# Patient Record
Sex: Female | Born: 1975 | Race: White | Hispanic: No | Marital: Married | Smoking: Never smoker
Health system: Southern US, Community
[De-identification: ages and names within clinical notes are randomized; demographics above are authoritative.]

## PROBLEM LIST (undated history)

## (undated) ENCOUNTER — Inpatient Hospital Stay (HOSPITAL_COMMUNITY): Payer: Self-pay

## (undated) DIAGNOSIS — N83209 Unspecified ovarian cyst, unspecified side: Secondary | ICD-10-CM

## (undated) DIAGNOSIS — D509 Iron deficiency anemia, unspecified: Secondary | ICD-10-CM

## (undated) DIAGNOSIS — N390 Urinary tract infection, site not specified: Secondary | ICD-10-CM

## (undated) DIAGNOSIS — Z789 Other specified health status: Secondary | ICD-10-CM

## (undated) DIAGNOSIS — B9689 Other specified bacterial agents as the cause of diseases classified elsewhere: Secondary | ICD-10-CM

## (undated) DIAGNOSIS — O039 Complete or unspecified spontaneous abortion without complication: Secondary | ICD-10-CM

## (undated) DIAGNOSIS — N760 Acute vaginitis: Secondary | ICD-10-CM

## (undated) HISTORY — DX: Iron deficiency anemia, unspecified: D50.9

## (undated) HISTORY — DX: Unspecified ovarian cyst, unspecified side: N83.209

## (undated) HISTORY — DX: Complete or unspecified spontaneous abortion without complication: O03.9

## (undated) HISTORY — PX: DILATION AND CURETTAGE OF UTERUS: SHX78

---

## 1997-08-25 ENCOUNTER — Inpatient Hospital Stay (HOSPITAL_COMMUNITY): Admission: AD | Admit: 1997-08-25 | Discharge: 1997-08-25 | Payer: Self-pay | Admitting: Obstetrics & Gynecology

## 1997-11-28 ENCOUNTER — Inpatient Hospital Stay (HOSPITAL_COMMUNITY): Admission: AD | Admit: 1997-11-28 | Discharge: 1997-11-28 | Payer: Self-pay | Admitting: Obstetrics

## 1998-02-21 ENCOUNTER — Emergency Department (HOSPITAL_COMMUNITY): Admission: EM | Admit: 1998-02-21 | Discharge: 1998-02-21 | Payer: Self-pay | Admitting: Emergency Medicine

## 1998-06-13 ENCOUNTER — Emergency Department (HOSPITAL_COMMUNITY): Admission: EM | Admit: 1998-06-13 | Discharge: 1998-06-13 | Payer: Self-pay | Admitting: Emergency Medicine

## 1998-06-13 ENCOUNTER — Encounter: Payer: Self-pay | Admitting: Emergency Medicine

## 1999-01-14 ENCOUNTER — Other Ambulatory Visit: Admission: RE | Admit: 1999-01-14 | Discharge: 1999-01-14 | Payer: Self-pay | Admitting: Obstetrics

## 1999-04-09 ENCOUNTER — Observation Stay (HOSPITAL_COMMUNITY): Admission: AD | Admit: 1999-04-09 | Discharge: 1999-04-10 | Payer: Self-pay | Admitting: Obstetrics

## 1999-05-20 ENCOUNTER — Inpatient Hospital Stay (HOSPITAL_COMMUNITY): Admission: AD | Admit: 1999-05-20 | Discharge: 1999-05-24 | Payer: Self-pay | Admitting: Obstetrics

## 2005-04-10 ENCOUNTER — Emergency Department (HOSPITAL_COMMUNITY): Admission: EM | Admit: 2005-04-10 | Discharge: 2005-04-10 | Payer: Self-pay | Admitting: Emergency Medicine

## 2005-06-30 ENCOUNTER — Emergency Department (HOSPITAL_COMMUNITY): Admission: EM | Admit: 2005-06-30 | Discharge: 2005-06-30 | Payer: Self-pay | Admitting: Emergency Medicine

## 2006-08-02 ENCOUNTER — Emergency Department (HOSPITAL_COMMUNITY): Admission: EM | Admit: 2006-08-02 | Discharge: 2006-08-02 | Payer: Self-pay | Admitting: Emergency Medicine

## 2008-08-09 ENCOUNTER — Inpatient Hospital Stay (HOSPITAL_COMMUNITY): Admission: AD | Admit: 2008-08-09 | Discharge: 2008-08-09 | Payer: Self-pay | Admitting: Obstetrics & Gynecology

## 2008-11-02 ENCOUNTER — Emergency Department (HOSPITAL_COMMUNITY): Admission: EM | Admit: 2008-11-02 | Discharge: 2008-11-02 | Payer: Self-pay | Admitting: Emergency Medicine

## 2009-03-13 ENCOUNTER — Inpatient Hospital Stay (HOSPITAL_COMMUNITY): Admission: RE | Admit: 2009-03-13 | Discharge: 2009-03-15 | Payer: Self-pay | Admitting: Obstetrics & Gynecology

## 2010-10-03 LAB — CBC
HCT: 31.3 % — ABNORMAL LOW (ref 36.0–46.0)
HCT: 35.8 % — ABNORMAL LOW (ref 36.0–46.0)
Hemoglobin: 10.6 g/dL — ABNORMAL LOW (ref 12.0–15.0)
Hemoglobin: 11.9 g/dL — ABNORMAL LOW (ref 12.0–15.0)
MCHC: 34 g/dL (ref 30.0–36.0)
MCHC: 33.3 g/dL (ref 30.0–36.0)
MCV: 87.8 fL (ref 78.0–100.0)
MCV: 87.9 fL (ref 78.0–100.0)
Platelets: 186 10*3/uL (ref 150–400)
Platelets: 226 10*3/uL (ref 150–400)
RBC: 3.56 MIL/uL — ABNORMAL LOW (ref 3.87–5.11)
RBC: 4.07 MIL/uL (ref 3.87–5.11)
RDW: 17.1 % — ABNORMAL HIGH (ref 11.5–15.5)
RDW: 16.9 % — ABNORMAL HIGH (ref 11.5–15.5)
WBC: 9.4 10*3/uL (ref 4.0–10.5)
WBC: 9.3 10*3/uL (ref 4.0–10.5)

## 2010-10-14 LAB — POCT PREGNANCY, URINE: Preg Test, Ur: POSITIVE

## 2010-10-14 LAB — URINE CULTURE: Colony Count: >=100000

## 2010-10-14 LAB — URINALYSIS, ROUTINE W REFLEX MICROSCOPIC
Glucose, UA: NEGATIVE mg/dL
Hgb urine dipstick: NEGATIVE
Protein, ur: NEGATIVE mg/dL
Specific Gravity, Urine: 1.02 (ref 1.005–1.030)

## 2010-10-14 LAB — GC/CHLAMYDIA PROBE AMP, GENITAL
Chlamydia, DNA Probe: NEGATIVE
GC Probe Amp, Genital: NEGATIVE

## 2010-10-14 LAB — URINE MICROSCOPIC-ADD ON

## 2010-10-14 LAB — CBC
Platelets: 203 10*3/uL (ref 150–400)
WBC: 7.5 10*3/uL (ref 4.0–10.5)

## 2010-10-14 LAB — HCG, QUANTITATIVE, PREGNANCY: hCG, Beta Chain, Quant, S: 88421 m[IU]/mL — ABNORMAL HIGH (ref <5)

## 2010-11-14 NOTE — Op Note (Signed)
[facility]  Patient:    Julia Padilla                     MRN: [id number] Proc. Date: 05/21/99 Adm. Date:  [id number] Attending:  Venita Sheffield                           Operative Report  PREOPERATIVE DIAGNOSIS:       Cephalopelvic disproportion.  POSTOPERATIVE DIAGNOSIS:      Cephalopelvic disproportion.  OPERATION:  SURGEON:                      Kathreen Cosier, M.D.  ANESTHESIA:                   Epidural.  DESCRIPTION OF PROCEDURE:     Patient was placed on the operating table in supine position, abdomen prepped and draped and bladder emptied with a Foley catheter.  After she was dosed and tested, a transverse suprapubic incision was made and carried down to the rectus fascia, fascia cleaned and incised the length of the  incision, rectus muscles retracted laterally, peritoneum incised longitudinally.  A transverse incision was made in the visceroperitoneum above the bladder and bladder mobilized inferiorly.  A transverse lower uterine incision was made.  The patient was delivered from the OP position of a female, Apgar 9/9, weighing 8 pounds 14 ounces; the team was in attendance.  The placenta was anterior and removed manually.  Uterine cavity was cleaned with dry laps.  The uterine incision was closed in one layer with #1 chromic, including myometrium and endometrium. Bladder flap was reattached with 2-0 chromic.  Uterus was well-contracted.  Tubes and ovaries were normal.  Abdomen was closed in layers -- peritoneum with continuous suture of 0 chromic, fascia with continuous suture of 0 Dexon and skin closed with subcuticular sutures of 3-0 plain.  Blood loss was 500 cc.  Patient tolerated the procedure well and taken to recovery room in good condition. DD:  05/21/99 TD:  05/23/99 Job: [id number]

## 2010-11-14 NOTE — H&P (Signed)
[facility]  Patient:    Julia Padilla                     MRN: [id number] Adm. Date:  [id number] Attending:  Venita Sheffield                         History and Physical  HISTORY OF PRESENT ILLNESS:   Patient is a 35 year old gravida 1, [facility] May 23, 1999, who was admitted in labor, cervix 3 cm, 80% and vertex was -2. Amniotomy was performed at 10:40 p.m. on May 20, 1999; the fluid was clear. Her beta strep was negative.  After epidural was placed, she progressed rapidly and by 6:20 a.m., she was fully dilated and pushing.  Fetal heart went up to 180 initially for 20 minutes, then came back to normal.  She was having mild variables with her contractions.  Patient pushed for greater than an hour and a half, with the vertex molded to a 0 station and no descent of the vertex when she pushed. It was decided she would be delivered by a C-section because of CPD.  PHYSICAL EXAMINATION:  GENERAL:                      Physical exam revealed a well-developed female in  labor.  HEENT:                        Negative.  LUNGS:                        Clear.  HEART:                        Regular rhythm.  No murmurs.  No gallops.  ABDOMEN:                      Term-size uterus.  Fetal heart 140.  PELVIC:                       As described above.  EXTREMITIES:                  Negative. DD:  05/21/99 TD:  05/21/99 Job: [id number]

## 2011-05-10 ENCOUNTER — Emergency Department (HOSPITAL_COMMUNITY): Payer: Self-pay

## 2011-05-10 ENCOUNTER — Emergency Department (HOSPITAL_COMMUNITY)
Admission: EM | Admit: 2011-05-10 | Discharge: 2011-05-11 | Disposition: A | Discharge status: Home / Self Care | Payer: Self-pay | Attending: Emergency Medicine | Admitting: Emergency Medicine

## 2011-05-10 DIAGNOSIS — E876 Hypokalemia: Secondary | ICD-10-CM | POA: Insufficient documentation

## 2011-05-10 DIAGNOSIS — E860 Dehydration: Secondary | ICD-10-CM | POA: Insufficient documentation

## 2011-05-10 DIAGNOSIS — N12 Tubulo-interstitial nephritis, not specified as acute or chronic: Secondary | ICD-10-CM | POA: Insufficient documentation

## 2011-05-10 DIAGNOSIS — R000 Tachycardia, unspecified: Secondary | ICD-10-CM | POA: Insufficient documentation

## 2011-05-10 LAB — URINALYSIS, ROUTINE W REFLEX MICROSCOPIC
Protein, ur: 100 mg/dL — AB
Urobilinogen, UA: 2 mg/dL — ABNORMAL HIGH (ref 0.0–1.0)

## 2011-05-10 LAB — BASIC METABOLIC PANEL
Calcium: 9 mg/dL (ref 8.4–10.5)
GFR calc non Af Amer: 76 mL/min — ABNORMAL LOW (ref >90)
Sodium: 133 mEq/L — ABNORMAL LOW (ref 135–145)

## 2011-05-10 LAB — POCT PREGNANCY, URINE: Preg Test, Ur: NEGATIVE

## 2011-05-10 LAB — URINE MICROSCOPIC-ADD ON

## 2011-05-10 MED ORDER — PROMETHAZINE HCL 25 MG/ML IJ SOLN
12.5000 mg | Freq: Once | INTRAMUSCULAR | Status: AC
  Administered 2011-05-10: 12.5 mg via INTRAVENOUS
  Filled 2011-05-10: qty 1

## 2011-05-10 MED ORDER — SODIUM CHLORIDE 0.9 % IV BOLUS (SEPSIS): 1000.0000 mL | Freq: Once | INTRAVENOUS | Status: DC

## 2011-05-10 MED ORDER — ONDANSETRON HCL 4 MG/2ML IJ SOLN
4.0000 mg | Freq: Once | INTRAMUSCULAR | Status: AC
  Administered 2011-05-10: 4 mg via INTRAVENOUS
  Filled 2011-05-10: qty 2

## 2011-05-10 MED ORDER — SODIUM CHLORIDE 0.9 % IV BOLUS (SEPSIS)
1000.0000 mL | Freq: Once | INTRAVENOUS | Status: AC
  Administered 2011-05-10: 1000 mL via INTRAVENOUS

## 2011-05-10 MED ORDER — DEXTROSE 5 % IV SOLN
1.0000 g | Freq: Once | INTRAVENOUS | Status: AC
  Administered 2011-05-10: 1 g via INTRAVENOUS
  Filled 2011-05-10: qty 10

## 2011-05-10 MED ORDER — ACETAMINOPHEN 650 MG RE SUPP
650.0000 mg | Freq: Once | RECTAL | Status: AC
  Administered 2011-05-10: 650 mg via RECTAL
  Filled 2011-05-10: qty 1

## 2011-05-10 MED ORDER — CEPHALEXIN 500 MG PO CAPS: 500.0000 mg | ORAL_CAPSULE | Freq: Four times a day (QID) | ORAL | Status: AC

## 2011-05-10 MED ORDER — PROMETHAZINE HCL 25 MG PO TABS: 25.0000 mg | ORAL_TABLET | Freq: Four times a day (QID) | ORAL | Status: AC | PRN

## 2011-05-10 MED ORDER — POTASSIUM CHLORIDE 10 MEQ/100ML IV SOLN
10.0000 meq | Freq: Once | INTRAVENOUS | Status: AC
  Administered 2011-05-10: 10 meq via INTRAVENOUS
  Filled 2011-05-10: qty 100

## 2011-05-10 MED ORDER — PROMETHAZINE HCL 25 MG/ML IJ SOLN: 12.5000 mg | Freq: Once | INTRAMUSCULAR | Status: DC

## 2011-05-10 MED ORDER — POTASSIUM CHLORIDE CRYS ER 20 MEQ PO TBCR: 20.0000 meq | EXTENDED_RELEASE_TABLET | Freq: Every day | ORAL | Status: DC

## 2011-05-10 MED ORDER — POTASSIUM CHLORIDE CRYS ER 20 MEQ PO TBCR
40.0000 meq | EXTENDED_RELEASE_TABLET | Freq: Once | ORAL | Status: AC
  Administered 2011-05-10: 40 meq via ORAL
  Filled 2011-05-10: qty 2

## 2011-05-10 MED ORDER — SODIUM CHLORIDE 0.9 % IV SOLN
INTRAVENOUS | Status: DC
  Administered 2011-05-10: 18:00:00 via INTRAVENOUS

## 2011-05-10 NOTE — ED Notes (Signed)
MD at bedside.-H. Beverely Pace, [place]

## 2011-05-10 NOTE — ED Notes (Signed)
Patient with sudden dry heaves, HB, PA made aware

## 2011-05-10 NOTE — ED Notes (Signed)
Pt presents with cough, fever, chills, vomiting, and diarrhea since Thursday. No active vomiting at this time. Pt also states she is feeling dizzy and weak.

## 2011-05-10 NOTE — ED Provider Notes (Signed)
History     CSN: [id number] Arrival date & time: 05/10/2011  4:33 PM   First MD Initiated Contact with Patient 05/10/11 1636      Chief Complaint  Patient presents with  . Cough  . Chills  . Emesis  . Diarrhea  . Fever    (Consider location/radiation/quality/duration/timing/severity/associated sxs/prior treatment) HPI Comments: Pt presents with3 to 4 days of not feeling good. She reports cough, fever and chills. Yesterday and today she had 1-2 episodes of diarrhea. She reports multiple episodes of vomiting. No new foods or meds. She has not been out of the country or in a new environment. She has been exposed to others with "flu" like symptoms.  Patient is a 35 y.o. female presenting with cough, vomiting, diarrhea, and fever. The history is provided by the patient.  Cough This is a new problem. The current episode started more than 2 days ago. The problem occurs hourly. The problem has been gradually worsening. The cough is non-productive. The maximum temperature recorded prior to her arrival was 101 to 101.9 F. The fever has been present for 1 to 2 days. Associated symptoms include chills, sweats, headaches and myalgias. Pertinent negatives include no chest pain, no shortness of breath and no wheezing. She has tried nothing for the symptoms. The treatment provided no relief. She is not a smoker. Her past medical history is significant for bronchitis.  Emesis  Associated symptoms include chills, cough, diarrhea, a fever, headaches, myalgias and sweats. Pertinent negatives include no abdominal pain and no arthralgias.  Diarrhea The primary symptoms include fever, vomiting, diarrhea and myalgias. Primary symptoms do not include abdominal pain, dysuria or arthralgias. The illness began yesterday.  The illness is also significant for chills. The illness does not include back pain. Associated medical issues do not include gallstones, liver disease, alcohol abuse, PUD, gastric bypass, irritable  bowel syndrome or diverticulitis.  Fever Primary symptoms of the febrile illness include fever, headaches, cough, vomiting, diarrhea and myalgias. Primary symptoms do not include wheezing, shortness of breath, abdominal pain, dysuria or arthralgias.  The headache is not associated with photophobia.    History reviewed. No pertinent past medical history.  Past Surgical History  Procedure Date  . Cesarean section     No family history on file.  History  Substance Use Topics  . Smoking status: Never Smoker   . Smokeless tobacco: Not on file  . Alcohol Use: No    OB History    Grav Para Term Preterm Abortions TAB SAB Ect Mult Living                  Review of Systems  Constitutional: Positive for fever and chills. Negative for activity change.       All ROS Neg except as noted in HPI  HENT: Negative for nosebleeds and neck pain.   Eyes: Negative for photophobia and discharge.  Respiratory: Positive for cough. Negative for shortness of breath and wheezing.   Cardiovascular: Negative for chest pain and palpitations.  Gastrointestinal: Positive for vomiting and diarrhea. Negative for abdominal pain and blood in stool.  Genitourinary: Negative for dysuria, frequency and hematuria.  Musculoskeletal: Positive for myalgias. Negative for back pain and arthralgias.  Skin: Negative.   Neurological: Positive for headaches. Negative for dizziness, seizures and speech difficulty.  Psychiatric/Behavioral: Negative for hallucinations and confusion.    Allergies  Review of patient's allergies indicates no known allergies.  Home Medications  No current outpatient prescriptions on file.  BP 89/60  Pulse 145  Temp(Src) 101.2 F (38.4 C) (Oral)  Resp 26  Ht 5\' 6"  (1.676 m)  Wt 180 lb (81.647 kg)  BMI 29.05 kg/m2  SpO2 100%  LMP 05/06/2011  Physical Exam  Nursing note and vitals reviewed. Constitutional: She is oriented to person, place, and time. She appears well-developed and  well-nourished.  Non-toxic appearance.  HENT:  Head: Normocephalic.  Right Ear: Tympanic membrane and external ear normal.  Left Ear: Tympanic membrane and external ear normal.  Mouth/Throat: Uvula is midline and mucous membranes are normal.       Nasal congestion noted. Minimal increase redness of the posterior pharynx.  Eyes: EOM and lids are normal. Pupils are equal, round, and reactive to light.  Neck: Normal range of motion. Neck supple. Carotid bruit is not present.  Cardiovascular: S1 normal, S2 normal, normal heart sounds, intact distal pulses and normal pulses.  Tachycardia present.  Exam reveals no gallop.   No murmur heard. Pulmonary/Chest: No respiratory distress. She has rhonchi.  Abdominal: Soft. Bowel sounds are normal. There is no tenderness. There is no guarding.  Musculoskeletal: Normal range of motion.  Lymphadenopathy:       Head (right side): No submandibular adenopathy present.       Head (left side): No submandibular adenopathy present.    She has no cervical adenopathy.  Neurological: She is alert and oriented to person, place, and time. She has normal strength. No cranial nerve deficit or sensory deficit.  Skin: Skin is warm and dry.  Psychiatric: She has a normal mood and affect. Her speech is normal.    ED Course: 6:20 test results given to pt. Heart rate improving, but still 110. N/V improving. Plan discussed. 6:31p Pt vomiting after po potassium attempted. IV fluids and promethazine given.  Procedures (including critical care time)   Labs Reviewed  BASIC METABOLIC PANEL  URINALYSIS, ROUTINE W REFLEX MICROSCOPIC  POCT PREGNANCY, URINE   No results found.   No diagnosis found.    MDM  I have reviewed nursing notes, vital signs, and all appropriate lab and imaging results for this patient. Results for orders placed during the hospital encounter of 05/10/11  BASIC METABOLIC PANEL      Component Value Range   Sodium 133 (*) 135 - 145 (mEq/L)    Potassium 2.9 (*) 3.5 - 5.1 (mEq/L)   Chloride 101  96 - 112 (mEq/L)   CO2 24  19 - 32 (mEq/L)   Glucose, Bld 147 (*) 70 - 99 (mg/dL)   BUN 13  6 - 23 (mg/dL)   Creatinine, Ser 1.61  0.50 - 1.10 (mg/dL)   Calcium 9.0  8.4 - 09.6 (mg/dL)   GFR calc non Af Amer 76 (*) >90 (mL/min)   GFR calc Af Amer 88 (*) >90 (mL/min)  URINALYSIS, ROUTINE W REFLEX MICROSCOPIC      Component Value Range   Color, Urine YELLOW  YELLOW    Appearance CLOUDY (*) CLEAR    Specific Gravity, Urine 1.020  1.005 - 1.030    pH 6.0  5.0 - 8.0    Glucose, UA NEGATIVE  NEGATIVE (mg/dL)   Hgb urine dipstick LARGE (*) NEGATIVE    Bilirubin Urine NEGATIVE  NEGATIVE    Ketones, ur >80 (*) NEGATIVE (mg/dL)   Protein, ur 045 (*) NEGATIVE (mg/dL)   Urobilinogen, UA 2.0 (*) 0.0 - 1.0 (mg/dL)   Nitrite POSITIVE (*) NEGATIVE    Leukocytes, UA SMALL (*) NEGATIVE   POCT PREGNANCY, URINE  Component Value Range   Preg Test, Ur NEGATIVE    URINE MICROSCOPIC-ADD ON      Component Value Range   Squamous Epithelial / LPF FEW (*) RARE    WBC, UA 21-50  <3 (WBC/hpf)   RBC / HPF 21-50  <3 (RBC/hpf)   Bacteria, UA MANY (*) RARE   URINE CULTURE      Component Value Range   Specimen Description URINE, CLEAN CATCH     Special Requests NONE     Setup Time [id number]     Colony Count >=100,000 COLONIES/ML     Culture ESCHERICHIA COLI     Report Status 05/13/2011 FINAL     Organism ID, Bacteria ESCHERICHIA COLI     Dg Chest Portable 1 View  05/10/2011  *RADIOLOGY REPORT*  Clinical Data: Cough and fever  PORTABLE CHEST - 1 VIEW  Comparison: None.  Findings: Normal mediastinum and cardiac silhouette.  Normal pulmonary  vasculature.  No evidence of effusion, infiltrate, or pneumothorax.  No acute bony abnormality.  IMPRESSION: Normal chest radiograph.  Original Report Authenticated By: Genevive Bi, M.D.          Kathie Dike, [place] 05/15/11 [phone]

## 2011-05-13 LAB — URINE CULTURE
Colony Count: >=100000
Culture  Setup Time: 201211130158

## 2011-05-14 NOTE — ED Notes (Addendum)
+   urine Patient treated with Keflex-sensitive to same-chart appended per protocol MD. 

## 2011-05-15 NOTE — ED Provider Notes (Signed)
Medical screening examination/treatment/procedure(s) were performed by non-physician practitioner and as supervising physician I was immediately available for consultation/collaboration.   Joya Gaskins, MD 05/15/11 [phone]

## 2011-11-18 ENCOUNTER — Inpatient Hospital Stay (HOSPITAL_COMMUNITY)
Admission: AD | Admit: 2011-11-18 | Discharge: 2011-11-18 | Disposition: A | Discharge status: Home / Self Care | Payer: Self-pay | Source: Ambulatory Visit | Attending: Obstetrics & Gynecology | Admitting: Obstetrics & Gynecology

## 2011-11-18 ENCOUNTER — Inpatient Hospital Stay (HOSPITAL_COMMUNITY): Payer: Self-pay

## 2011-11-18 ENCOUNTER — Encounter (HOSPITAL_COMMUNITY): Payer: Self-pay | Admitting: *Deleted

## 2011-11-18 DIAGNOSIS — N912 Amenorrhea, unspecified: Secondary | ICD-10-CM | POA: Insufficient documentation

## 2011-11-18 DIAGNOSIS — Z3490 Encounter for supervision of normal pregnancy, unspecified, unspecified trimester: Secondary | ICD-10-CM

## 2011-11-18 DIAGNOSIS — Z1389 Encounter for screening for other disorder: Secondary | ICD-10-CM

## 2011-11-18 DIAGNOSIS — Z3201 Encounter for pregnancy test, result positive: Secondary | ICD-10-CM | POA: Insufficient documentation

## 2011-11-18 HISTORY — DX: Other specified health status: Z78.9

## 2011-11-18 LAB — URINALYSIS, ROUTINE W REFLEX MICROSCOPIC
Bilirubin Urine: NEGATIVE
Nitrite: NEGATIVE
Specific Gravity, Urine: 1.02 (ref 1.005–1.030)
Urobilinogen, UA: 0.2 mg/dL (ref 0.0–1.0)
pH: 7 (ref 5.0–8.0)

## 2011-11-18 LAB — HCG, QUANTITATIVE, PREGNANCY: hCG, Beta Chain, Quant, S: 39108 m[IU]/mL — ABNORMAL HIGH (ref <5)

## 2011-11-18 LAB — CBC
MCH: 26.3 pg (ref 26.0–34.0)
MCHC: 32.4 g/dL (ref 30.0–36.0)
MCV: 81.3 fL (ref 78.0–100.0)
Platelets: 199 10*3/uL (ref 150–400)
RBC: 3.84 MIL/uL — ABNORMAL LOW (ref 3.87–5.11)
RDW: 16.4 % — ABNORMAL HIGH (ref 11.5–15.5)

## 2011-11-18 LAB — URINE MICROSCOPIC-ADD ON

## 2011-11-18 LAB — WET PREP, GENITAL

## 2011-11-18 LAB — POCT PREGNANCY, URINE: Preg Test, Ur: POSITIVE — AB

## 2011-11-18 NOTE — Discharge Instructions (Signed)
No smoking, no drugs, no alcohol.  Take a prenatal vitamin one by mouth every day.  Eat small frequent snacks to avoid nausea.  Begin prenatal care as soon as possible.  Prenatal Care [facility]    [facility]  [phone]     Phone: [phone]          [facility]                      [facility]  @[place]     Phone: [phone]  Phone: [phone]         [facility]     Phone: [phone]  Phone: [phone]           [facility] [place]                hone: [phone]  Phone: [phone]         [facility] [address] Francoise Ceo      Phone: [phone]  Phone: [phone]         [facility] Dept[facility]                Phone: [phone]  [facility]   Phone:[phone]    Family [address] [place])          Phone: [phone] [facility]   Phone: [phone]      ________________________________________     To schedule your Maternity Eligibility Appointment, please call [phone].  When you arrive for your appointment you must bring the following items or information listed below.  Your appointment will be rescheduled if you do not have these items or are 15 minutes late. If currently receiving Medicaid, you MUST bring: 1. Medicaid Card 2. Social Security Card 3. Picture ID 4. Proof of Pregnancy 5. Verification of current address if the address on Medicaid card is incorrect "postmarked mail" If not receiving Medicaid, you MUST bring: 1. Social Security Card 2. Picture ID 3. Birth Certificate (if available) Passport or *Green Card 4. Proof of Pregnancy 5. Verification of current address "postmarked mail" for each income presented. 6. Verification of insurance coverage, if any 7. Check stubs from each employer for the previous month (if unable to present  check stub  for each week, we will accept check stub for the first and last week ill the same month.) If you can't locate check stubs, you must bring a letter from the employer(s) and it must have the following information on letterhead, typed, in English: o name of company o company telephone number o how long been with the company, if less than one month o how much person earns per hour o how many hours per week work o the gross pay the person earned for the previous month If you are 34 years old or less, you do not have to bring proof of income unless you work or live with the father of the baby and at that time we will need proof of income from you and/or the father of the baby. Green Card recipients are eligible for Medicaid for Pregnant Women (MPW)

## 2011-11-18 NOTE — MAU Note (Signed)
Patient states she terminated a pregnancy 3-24. Was put on BCP's and took them as ordered. Had a little brown discharge but did not have a period. Missed the second period and took a home pregnancy test that was positive. Not having any problems at this time.

## 2011-11-18 NOTE — MAU Note (Signed)
Pt had a termination middle of March.  Took birth control pills for 3 weeks.  Had some brown discharge x1 day.  Did not continue bcp.  Took home preg test last night, positive.  Denies any bleeding, cramping, or discharge.

## 2011-11-18 NOTE — MAU Provider Note (Signed)
History     CSN: [id number]  Arrival date and time: 11/18/11 1150   First Provider Initiated Contact with Patient 11/18/11 1250      Chief Complaint  Patient presents with  . Possible Pregnancy   HPI Comments: Patient is a 36 y/o G51P2012 Caucasian female present today for a missed period and possible UTI. States she had a EAB in mid-March and was given "white and brown" pills for contraception after the procedure.  Took the pills as directed but did not return to the clinic in [facility] for f/u. Home pregnancy test this AM was positive. Is currently involved in a divorce. Recently has been using condoms during intercourse with a new partner since the procedure. Unsure as to when conception might have occurred. Denies possible STI exposure, cramping or active bleeding.   OB History    Grav Para Term Preterm Abortions TAB SAB Ect Mult Living   4 2 2  0 1 1 0 0 0 2      Past Medical History  Diagnosis Date  . No pertinent past medical history     Past Surgical History  Procedure Date  . Cesarean section   . Dilation and curettage of uterus     Family History  Problem Relation Age of Onset  . Anesthesia problems Neg Hx     History  Substance Use Topics  . Smoking status: Never Smoker   . Smokeless tobacco: Not on file  . Alcohol Use: No    Allergies: No Known Allergies  No prescriptions prior to admission    Review of Systems  Constitutional: Negative for fever and chills.  Gastrointestinal: Negative.  Negative for nausea, vomiting, abdominal pain and blood in stool.  Genitourinary: Positive for urgency and frequency. Negative for dysuria, hematuria and flank pain.       No cramping, Vaginal pain or bleeding   Physical Exam   Blood pressure 121/73, pulse 16, temperature 97.9 F (36.6 C), temperature source Oral, height 5' 6.5" (1.689 m), weight 86.546 kg (190 lb 12.8 oz), SpO2 99.00%.  Physical Exam  Constitutional: She is oriented to person, place, and  time. She appears well-developed and well-nourished. No distress.  Cardiovascular: Normal rate, regular rhythm and intact distal pulses.  Exam reveals no gallop and no friction rub.   Respiratory: Effort normal and breath sounds normal. No respiratory distress. She has no wheezes. She has no rales. She exhibits no tenderness.  GI: There is no CVA tenderness.  Genitourinary: Vagina normal. Pelvic exam was performed with patient supine. No labial fusion. There is no rash, tenderness, lesion or injury on the right labia. There is no rash, tenderness, lesion or injury on the left labia. Uterus is enlarged. Cervix exhibits no discharge and no friability.       Vaginal mucosa mildly injected. Milky white discharge Cervix closed.  Some vaginal discomfort with speculum exam and bimanual. No adnexal masses appreciated Fundus palpated just below the umbilicus.   Neurological: She is alert and oriented to person, place, and time.  Skin: She is not diaphoretic.  Psychiatric: She has a normal mood and affect. Her speech is normal and behavior is normal.   Results for orders placed during the hospital encounter of 11/18/11 (from the past 24 hour(s))  URINALYSIS, ROUTINE W REFLEX MICROSCOPIC     Status: Abnormal   Collection Time   11/18/11 12:05 PM      Component Value Range   Color, Urine YELLOW  YELLOW    APPearance  CLEAR  CLEAR    Specific Gravity, Urine 1.020  1.005 - 1.030    pH 7.0  5.0 - 8.0    Glucose, UA NEGATIVE  NEGATIVE (mg/dL)   Hgb urine dipstick NEGATIVE  NEGATIVE    Bilirubin Urine NEGATIVE  NEGATIVE    Ketones, ur NEGATIVE  NEGATIVE (mg/dL)   Protein, ur NEGATIVE  NEGATIVE (mg/dL)   Urobilinogen, UA 0.2  0.0 - 1.0 (mg/dL)   Nitrite NEGATIVE  NEGATIVE    Leukocytes, UA SMALL (*) NEGATIVE   URINE MICROSCOPIC-ADD ON     Status: Abnormal   Collection Time   11/18/11 12:05 PM      Component Value Range   Squamous Epithelial / LPF FEW (*) RARE    WBC, UA TOO NUMEROUS TO COUNT  <3  (WBC/hpf)   Bacteria, UA FEW (*) RARE   POCT PREGNANCY, URINE     Status: Abnormal   Collection Time   11/18/11 12:13 PM      Component Value Range   Preg Test, Ur POSITIVE (*) NEGATIVE   CBC     Status: Abnormal   Collection Time   11/18/11  1:25 PM      Component Value Range   WBC 6.3  4.0 - 10.5 (K/uL)   RBC 3.84 (*) 3.87 - 5.11 (MIL/uL)   Hemoglobin 10.1 (*) 12.0 - 15.0 (g/dL)   HCT 62.1 (*) 30.8 - 46.0 (%)   MCV 81.3  78.0 - 100.0 (fL)   MCH 26.3  26.0 - 34.0 (pg)   MCHC 32.4  30.0 - 36.0 (g/dL)   RDW 65.7 (*) 84.6 - 15.5 (%)   Platelets 199  150 - 400 (K/uL)  HCG, QUANTITATIVE, PREGNANCY     Status: Abnormal   Collection Time   11/18/11  1:25 PM      Component Value Range   hCG, Beta Chain, Quant, S 96295 (*) <5 (mIU/mL)  WET PREP, GENITAL     Status: Abnormal   Collection Time   11/18/11  2:00 PM      Component Value Range   Yeast Wet Prep HPF POC NONE SEEN  NONE SEEN    Trich, Wet Prep NONE SEEN  NONE SEEN    Clue Cells Wet Prep HPF POC NONE SEEN  NONE SEEN    WBC, Wet Prep HPF POC FEW (*) NONE SEEN      MAU Course  Procedures UA  UA micro  Urine pregnancy Quant preg CBC Pelvic exam  Complete Abdominal US   MDM Intrauterine pregnancy- new  Failed EAB     Assessment and Plan  Awaiting [place] and lab results  Discuss prenatal care  D/C home   Assessment: Viable pregnancy at [redacted] weeks gestation  1536- serum result discussed with patient. Notified that we are awaiting [place] results. 35- [place] results discussed with patient. EDD is 05/03/12 at [redacted] weeks gestation. Zion Ta 11/18/2011, 1:29 PM

## 2011-11-18 NOTE — MAU Note (Signed)
Has a history of recurrent UTI's and has some of the symptoms now.

## 2013-03-08 ENCOUNTER — Ambulatory Visit: Payer: Self-pay | Admitting: Family Medicine

## 2014-03-20 ENCOUNTER — Emergency Department (HOSPITAL_COMMUNITY)
Admission: EM | Admit: 2014-03-20 | Discharge: 2014-03-20 | Disposition: A | Discharge status: Home / Self Care | Payer: Self-pay | Attending: Emergency Medicine | Admitting: Emergency Medicine

## 2014-03-20 ENCOUNTER — Encounter (HOSPITAL_COMMUNITY): Payer: Self-pay | Admitting: Emergency Medicine

## 2014-03-20 DIAGNOSIS — N390 Urinary tract infection, site not specified: Secondary | ICD-10-CM | POA: Insufficient documentation

## 2014-03-20 DIAGNOSIS — N898 Other specified noninflammatory disorders of vagina: Secondary | ICD-10-CM | POA: Insufficient documentation

## 2014-03-20 DIAGNOSIS — Z3202 Encounter for pregnancy test, result negative: Secondary | ICD-10-CM | POA: Insufficient documentation

## 2014-03-20 DIAGNOSIS — R300 Dysuria: Secondary | ICD-10-CM | POA: Insufficient documentation

## 2014-03-20 LAB — COMPREHENSIVE METABOLIC PANEL
ALK PHOS: 58 U/L (ref 39–117)
ALT: 10 U/L (ref 0–35)
AST: 13 U/L (ref 0–37)
Albumin: 4.1 g/dL (ref 3.5–5.2)
Anion gap: 11 (ref 5–15)
BUN: 8 mg/dL (ref 6–23)
CALCIUM: 9.2 mg/dL (ref 8.4–10.5)
CO2: 23 mEq/L (ref 19–32)
Chloride: 105 mEq/L (ref 96–112)
Creatinine, Ser: 0.74 mg/dL (ref 0.50–1.10)
GFR calc Af Amer: >90 mL/min (ref >90)
GFR calc non Af Amer: >90 mL/min (ref >90)
Glucose, Bld: 101 mg/dL — ABNORMAL HIGH (ref 70–99)
POTASSIUM: 4.4 meq/L (ref 3.7–5.3)
SODIUM: 139 meq/L (ref 137–147)
TOTAL PROTEIN: 7.7 g/dL (ref 6.0–8.3)
Total Bilirubin: 0.7 mg/dL (ref 0.3–1.2)

## 2014-03-20 LAB — HIV ANTIBODY (ROUTINE TESTING W REFLEX): HIV 1&2 Ab, 4th Generation: NONREACTIVE

## 2014-03-20 LAB — URINALYSIS, ROUTINE W REFLEX MICROSCOPIC
BILIRUBIN URINE: NEGATIVE
Glucose, UA: NEGATIVE mg/dL
HGB URINE DIPSTICK: NEGATIVE
KETONES UR: NEGATIVE mg/dL
LEUKOCYTES UA: NEGATIVE
NITRITE: NEGATIVE
Protein, ur: NEGATIVE mg/dL
SPECIFIC GRAVITY, URINE: 1.006 (ref 1.005–1.030)
Urobilinogen, UA: 0.2 mg/dL (ref 0.0–1.0)
pH: 6.5 (ref 5.0–8.0)

## 2014-03-20 LAB — CBC WITH DIFFERENTIAL/PLATELET
BASOS PCT: 0 % (ref 0–1)
Basophils Absolute: 0 10*3/uL (ref 0.0–0.1)
EOS ABS: 0 10*3/uL (ref 0.0–0.7)
EOS PCT: 1 % (ref 0–5)
HCT: 31.4 % — ABNORMAL LOW (ref 36.0–46.0)
HEMOGLOBIN: 9.2 g/dL — AB (ref 12.0–15.0)
LYMPHS PCT: 29 % (ref 12–46)
Lymphs Abs: 1.2 10*3/uL (ref 0.7–4.0)
MCH: 20 pg — AB (ref 26.0–34.0)
MCHC: 29.3 g/dL — AB (ref 30.0–36.0)
MCV: 68.1 fL — ABNORMAL LOW (ref 78.0–100.0)
MONO ABS: 0.4 10*3/uL (ref 0.1–1.0)
Monocytes Relative: 10 % (ref 3–12)
NEUTROS PCT: 60 % (ref 43–77)
Neutro Abs: 2.5 10*3/uL (ref 1.7–7.7)
PLATELETS: 273 10*3/uL (ref 150–400)
RBC: 4.61 MIL/uL (ref 3.87–5.11)
RDW: 16.7 % — ABNORMAL HIGH (ref 11.5–15.5)
WBC: 4.1 10*3/uL (ref 4.0–10.5)

## 2014-03-20 LAB — WET PREP, GENITAL
TRICH WET PREP: NONE SEEN
YEAST WET PREP: NONE SEEN

## 2014-03-20 LAB — POC URINE PREG, ED: PREG TEST UR: NEGATIVE

## 2014-03-20 LAB — CBG MONITORING, ED: Glucose-Capillary: 93 mg/dL (ref 70–99)

## 2014-03-20 LAB — RPR

## 2014-03-20 MED ORDER — METRONIDAZOLE 500 MG PO TABS: 500.0000 mg | ORAL_TABLET | Freq: Two times a day (BID) | ORAL | Status: DC

## 2014-03-20 NOTE — ED Notes (Signed)
CBG 93 

## 2014-03-20 NOTE — ED Notes (Signed)
Per pt, states urinary frequency for over a week-abdominal pain on and off

## 2014-03-20 NOTE — Discharge Instructions (Signed)

## 2014-03-20 NOTE — Progress Notes (Signed)
[facility] [employer],  Provided pt with a list of self-pay providers to help patient establish primary care. Patient stated that she was pending [employer] with job.

## 2014-03-20 NOTE — ED Provider Notes (Signed)
CSN: [id number]     Arrival date & time 03/20/14  1125 History  This chart was scribed for non-physician practitioner, Junious Silk, PA-C working with Lyanne Co, MD by Greggory Stallion, ED scribe. This patient was seen in room WTR7/WTR7 and the patient's care was started at 12:19 PM.   Chief Complaint  Patient presents with  . Urinary Tract Infection   The history is provided by the patient. No language interpreter was used.   HPI Comments: Julia Padilla is a 38 y.o. female who presents to the Emergency Department complaining of worsening urinary frequency, dysuria, urine odor and mild vaginal discharge that started about one week ago. Also reports intermittent abdominal cramping. Pt has history of bladder infections but states this doesn't feel similar. She has not taken any OTC medications yet. Denies any new sexual partners.  Past Medical History  Diagnosis Date  . No pertinent past medical history    Past Surgical History  Procedure Laterality Date  . Cesarean section    . Dilation and curettage of uterus     Family History  Problem Relation Age of Onset  . Anesthesia problems Neg Hx    History  Substance Use Topics  . Smoking status: Never Smoker   . Smokeless tobacco: Not on file  . Alcohol Use: No   OB History   Grav Para Term Preterm Abortions TAB SAB Ect Mult Living   0 1 1 0 0 0 2     Review of Systems  Gastrointestinal: Positive for abdominal pain.  Genitourinary: Positive for dysuria, frequency and vaginal discharge.  All other systems reviewed and are negative.  Allergies  Review of patient's allergies indicates no known allergies.  Home Medications   Prior to Admission medications   Not on File   BP 127/79  Pulse 91  Temp(Src) 97.8 F (36.6 C) (Oral)  Resp 16  SpO2 100%  LMP 02/23/2014  Physical Exam  Nursing note and vitals reviewed. Constitutional: She is oriented to person, place, and time. She appears well-developed and  well-nourished. No distress.  HENT:  Head: Normocephalic and atraumatic.  Right Ear: External ear normal.  Left Ear: External ear normal.  Nose: Nose normal.  Mouth/Throat: Oropharynx is clear and moist.  Eyes: Conjunctivae are normal.  Neck: Normal range of motion.  Cardiovascular: Normal rate, regular rhythm and normal heart sounds.   Pulmonary/Chest: Effort normal and breath sounds normal. No stridor. No respiratory distress. She has no wheezes. She has no rales.  Abdominal: Soft. She exhibits no distension.  Genitourinary: Vaginal discharge found.  Scant white vaginal discharge. Diffusely tender to palpation. No cervical motion tenderness. Minimal bladder prolapse.   Musculoskeletal: Normal range of motion.  Neurological: She is alert and oriented to person, place, and time. She has normal strength.  Skin: Skin is warm and dry. She is not diaphoretic. No erythema.  Psychiatric: She has a normal mood and affect. Her behavior is normal.    ED Course  Procedures (including critical care time)  DIAGNOSTIC STUDIES: Oxygen Saturation is 100% on RA, normal by my interpretation.    COORDINATION OF CARE: 12:21 PM-Advised pt of UA results. Discussed treatment plan which includes pelvic exam with pt at bedside and pt agreed to plan.   Labs Review Labs Reviewed  WET PREP, GENITAL - Abnormal; Notable for the following:    Clue Cells Wet Prep HPF POC FEW (*)    WBC, Wet Prep HPF POC FEW (*)  All other components within normal limits  CBC WITH DIFFERENTIAL - Abnormal; Notable for the following:    Hemoglobin 9.2 (*)    HCT 31.4 (*)    MCV 68.1 (*)    MCH 20.0 (*)    MCHC 29.3 (*)    RDW 16.7 (*)    All other components within normal limits  COMPREHENSIVE METABOLIC PANEL - Abnormal; Notable for the following:    Glucose, Bld 101 (*)    All other components within normal limits  GC/CHLAMYDIA PROBE AMP  URINALYSIS, ROUTINE W REFLEX MICROSCOPIC  HIV ANTIBODY (ROUTINE TESTING)  RPR   POC URINE PREG, ED  CBG MONITORING, ED    Imaging Review No results found.   EKG Interpretation None      MDM   Final diagnoses:  Dysuria    Patient presents to ED with dysuria and mild vaginal discharge. Pelvic exam shows minimal vaginal discharge, mild bladder prolapse. Wet prep shows bacterial vaginosis. Will give flagyl for this. Patient encouraged to follow up with GYN for bladder prolapse. Discussed reasons to return to ED immediately. Vital signs stable for discharge. Patient / Family / Caregiver informed of clinical course, understand medical decision-making process, and agree with plan.   I personally performed the services described in this documentation, which was scribed in my presence. The recorded information has been reviewed and is accurate.  Mora Bellman, PA-C 03/24/14 [phone]

## 2014-03-21 LAB — GC/CHLAMYDIA PROBE AMP
CT Probe RNA: NEGATIVE
GC Probe RNA: NEGATIVE

## 2014-03-24 NOTE — ED Provider Notes (Signed)
Medical screening examination/treatment/procedure(s) were performed by non-physician practitioner and as supervising physician I was immediately available for consultation/collaboration.   EKG Interpretation None        Romari Gasparro M Arine Foley, MD 03/24/14 2135 

## 2014-04-29 ENCOUNTER — Encounter (HOSPITAL_COMMUNITY): Payer: Self-pay | Admitting: Family Medicine

## 2014-04-29 ENCOUNTER — Emergency Department (HOSPITAL_COMMUNITY)
Admission: EM | Admit: 2014-04-29 | Discharge: 2014-04-29 | Disposition: A | Discharge status: Home / Self Care | Payer: Self-pay | Attending: Emergency Medicine | Admitting: Emergency Medicine

## 2014-04-29 DIAGNOSIS — R110 Nausea: Secondary | ICD-10-CM | POA: Insufficient documentation

## 2014-04-29 DIAGNOSIS — N3000 Acute cystitis without hematuria: Secondary | ICD-10-CM | POA: Insufficient documentation

## 2014-04-29 DIAGNOSIS — Z3202 Encounter for pregnancy test, result negative: Secondary | ICD-10-CM | POA: Insufficient documentation

## 2014-04-29 DIAGNOSIS — Z79899 Other long term (current) drug therapy: Secondary | ICD-10-CM | POA: Insufficient documentation

## 2014-04-29 DIAGNOSIS — R197 Diarrhea, unspecified: Secondary | ICD-10-CM | POA: Insufficient documentation

## 2014-04-29 LAB — URINE MICROSCOPIC-ADD ON

## 2014-04-29 LAB — PREGNANCY, URINE: Preg Test, Ur: NEGATIVE

## 2014-04-29 LAB — WET PREP, GENITAL
Clue Cells Wet Prep HPF POC: NONE SEEN
Trich, Wet Prep: NONE SEEN
WBC, Wet Prep HPF POC: NONE SEEN
Yeast Wet Prep HPF POC: NONE SEEN

## 2014-04-29 LAB — URINALYSIS, ROUTINE W REFLEX MICROSCOPIC
BILIRUBIN URINE: NEGATIVE
Glucose, UA: NEGATIVE mg/dL
HGB URINE DIPSTICK: NEGATIVE
Ketones, ur: NEGATIVE mg/dL
NITRITE: POSITIVE — AB
Protein, ur: NEGATIVE mg/dL
SPECIFIC GRAVITY, URINE: 1.027 (ref 1.005–1.030)
Urobilinogen, UA: 0.2 mg/dL (ref 0.0–1.0)
pH: 5.5 (ref 5.0–8.0)

## 2014-04-29 MED ORDER — CEFTRIAXONE SODIUM 1 G IJ SOLR
1.0000 g | Freq: Once | INTRAMUSCULAR | Status: AC
  Administered 2014-04-29: 1 g via INTRAMUSCULAR
  Filled 2014-04-29: qty 10

## 2014-04-29 MED ORDER — FLUCONAZOLE 150 MG PO TABS: 150.0000 mg | ORAL_TABLET | Freq: Once | ORAL | Status: DC

## 2014-04-29 MED ORDER — LIDOCAINE HCL (PF) 1 % IJ SOLN
INTRAMUSCULAR | Status: AC
  Administered 2014-04-29: 2 mL
  Filled 2014-04-29: qty 5

## 2014-04-29 MED ORDER — TRAMADOL HCL 50 MG PO TABS
50.0000 mg | ORAL_TABLET | Freq: Once | ORAL | Status: DC
  Filled 2014-04-29: qty 1

## 2014-04-29 MED ORDER — CEPHALEXIN 500 MG PO CAPS: 500.0000 mg | ORAL_CAPSULE | Freq: Two times a day (BID) | ORAL | Status: DC

## 2014-04-29 MED ORDER — LIDOCAINE HCL (PF) 1 % IJ SOLN
2.0000 mL | Freq: Once | INTRAMUSCULAR | Status: AC
  Administered 2014-04-29: 2 mL

## 2014-04-29 MED ORDER — IBUPROFEN 800 MG PO TABS: 800.0000 mg | ORAL_TABLET | Freq: Three times a day (TID) | ORAL | Status: DC | PRN

## 2014-04-29 MED ORDER — AZITHROMYCIN 250 MG PO TABS
1000.0000 mg | ORAL_TABLET | Freq: Once | ORAL | Status: AC
  Administered 2014-04-29: 1000 mg via ORAL
  Filled 2014-04-29: qty 4

## 2014-04-29 MED ORDER — IBUPROFEN 800 MG PO TABS
800.0000 mg | ORAL_TABLET | Freq: Once | ORAL | Status: AC
  Administered 2014-04-29: 800 mg via ORAL
  Filled 2014-04-29: qty 1

## 2014-04-29 NOTE — ED Notes (Signed)
Per pt recently treated for UTI. sts was better and then she got a yeast infection and treated that. No sts severe pain in vaginal area. Denies discharge.

## 2014-04-29 NOTE — Discharge Instructions (Signed)
Urinary Tract Infection Urinary tract infections (UTIs) can develop anywhere along your urinary tract. Your urinary tract is your body's drainage system for removing wastes and extra water. Your urinary tract includes two kidneys, two ureters, a bladder, and a urethra. Your kidneys are a pair of bean-shaped organs. Each kidney is about the size of your fist. They are located below your ribs, one on each side of your spine. CAUSES Infections are caused by microbes, which are microscopic organisms, including fungi, viruses, and bacteria. These organisms are so small that they can only be seen through a microscope. Bacteria are the microbes that most commonly cause UTIs. SYMPTOMS  Symptoms of UTIs may vary by age and gender of the patient and by the location of the infection. Symptoms in young women typically include a frequent and intense urge to urinate and a painful, burning feeling in the bladder or urethra during urination. Older women and men are more likely to be tired, shaky, and weak and have muscle aches and abdominal pain. A fever may mean the infection is in your kidneys. Other symptoms of a kidney infection include pain in your back or sides below the ribs, nausea, and vomiting. DIAGNOSIS To diagnose a UTI, your caregiver will ask you about your symptoms. Your caregiver also will ask to provide a urine sample. The urine sample will be tested for bacteria and white blood cells. White blood cells are made by your body to help fight infection. TREATMENT  Typically, UTIs can be treated with medication. Because most UTIs are caused by a bacterial infection, they usually can be treated with the use of antibiotics. The choice of antibiotic and length of treatment depend on your symptoms and the type of bacteria causing your infection. HOME CARE INSTRUCTIONS  If you were prescribed antibiotics, take them exactly as your caregiver instructs you. Finish the medication even if you feel better after you  have only taken some of the medication.  Drink enough water and fluids to keep your urine clear or pale yellow.  Avoid caffeine, tea, and carbonated beverages. They tend to irritate your bladder.  Empty your bladder often. Avoid holding urine for long periods of time.  Empty your bladder before and after sexual intercourse.  After a bowel movement, women should cleanse from front to back. Use each tissue only once. SEEK MEDICAL CARE IF:   You have back pain.  You develop a fever.  Your symptoms do not begin to resolve within 3 days. SEEK IMMEDIATE MEDICAL CARE IF:   You have severe back pain or lower abdominal pain.  You develop chills.  You have nausea or vomiting.  You have continued burning or discomfort with urination. MAKE SURE YOU:   Understand these instructions.  Will watch your condition.  Will get help right away if you are not doing well or get worse. Document Released: 03/25/2005 Document Revised: 12/15/2011 Document Reviewed: 07/24/2011 [facility] Patient Information 2015 [employer] [place]. This information is not intended to replace advice given to you by your health care provider. Make sure you discuss any questions you have with your health care provider.    [facility] [email or web address] [address] [place], [place] [phone]    [facility] [email or web address] [address] [place], [place] [phone]    [facility] [address] [place], [place] [phone]   [facility] [email or web address] [address] [place], [place] [phone]   [facility] [email or web address] [address] [place], [place] [phone]   [facility] [email or web address] [address] [place], [place] [phone]

## 2014-04-29 NOTE — ED Provider Notes (Signed)
TIME SEEN: 7:30 PM  CHIEF COMPLAINT: vaginal pain  HPI: Pt is a 38 year old G4 P2 A2 who just finished her menstrual period who presents emergency department with foul-smelling urine, vaginal pain for the past several days. She reports that she went to the emergency 3 weeks ago because she thought she had a UTI because she was having pain with urination. She was found to have bacterial vaginosis and started on metronidazole. She reports her symptoms do not improve so she took Monistat over-the-counter without improvement of symptoms. She states she is having severe vaginal pain. No hematuria, urinary frequency or urgency. She has had subjective fevers, nausea and diarrhea. She is also complaining of suprapubic pain. No vaginal discharge. No history of STDs. She is sexually active with one partner and they use condoms.  ROS: See HPI Constitutional: subjective fever  Eyes: no drainage  ENT: no runny nose   Cardiovascular:  no chest pain  Resp: no SOB  GI: no vomiting GU: no dysuria Integumentary: no rash  Allergy: no hives  Musculoskeletal: no leg swelling  Neurological: no slurred speech ROS otherwise negative  PAST MEDICAL HISTORY/PAST SURGICAL HISTORY:  Past Medical History  Diagnosis Date  . No pertinent past medical history     MEDICATIONS:  Prior to Admission medications   Medication Sig Start Date End Date Taking? Authorizing Provider  metroNIDAZOLE (FLAGYL) 500 MG tablet Take 1 tablet (500 mg total) by mouth 2 (two) times daily. One po bid x 7 days 03/20/14   Mora BellmanHannah S Merrell, PA-C  vitamin B-12 (CYANOCOBALAMIN) 500 MCG tablet Take 500 mcg by mouth daily.    Historical Provider, MD    ALLERGIES:  No Known Allergies  SOCIAL HISTORY:  History  Substance Use Topics  . Smoking status: Never Smoker   . Smokeless tobacco: Not on file  . Alcohol Use: No    FAMILY HISTORY: Family History  Problem Relation Age of Onset  . Anesthesia problems Neg Hx     EXAM: BP 107/65  mmHg  Pulse 85  Temp(Src) 97.7 F (36.5 C) (Oral)  Resp 10  SpO2 100%  LMP 04/29/2014 CONSTITUTIONAL: Alert and oriented and responds appropriately to questions. Well-appearing; well-nourished, in no distress, smiling, laughing HEAD: Normocephalic EYES: Conjunctivae clear, PERRL ENT: normal nose; no rhinorrhea; moist mucous membranes; pharynx without lesions noted NECK: Supple, no meningismus, no LAD  CARD: RRR; S1 and S2 appreciated; no murmurs, no clicks, no rubs, no gallops RESP: Normal chest excursion without splinting or tachypnea; breath sounds clear and equal bilaterally; no wheezes, no rhonchi, no rales,  ABD/GI: Normal bowel sounds; non-distended; soft, mild suprapubic tenderness, no tenderness at McBurney's point, negative Murphy sign, no guarding or rebound GU:  Normal external genitalia without lesions or irritation, patient has large amount of white was slightly yellow tinge without foul odor vaginal discharge, there is no cervical motion tenderness or adnexal tenderness or fullness, cervix is not friable or erythematous, no vaginal bleeding BACK:  The back appears normal and is non-tender to palpation, there is no CVA tenderness EXT: Normal ROM in all joints; non-tender to palpation; no edema; normal capillary refill; no cyanosis    SKIN: Normal color for age and race; warm NEURO: Moves all extremities equally PSYCH: The patient's mood and manner are appropriate. Grooming and personal hygiene are appropriate.  MEDICAL DECISION MAKING: agent does have a urinary tract infection. We'll send culture. She would like a dose of IM ceftriaxone. We'll perform pelvic exam with cultures. Her abdominal exam is  relatively benign. She is well-appearing with no flank pain. Doubt pyelonephritis, urosepsis. Doubt renal stone.  ED PROGRESS: Pelvic exam is relatively unremarkable. Pelvic cultures pending. She states she was recently tested for gonorrhea and chlamydia and this was negative. Wet  prep pending.   9:36 PM  Pt's wet prep is negative.  Given large amount of discharge, will empirically treat for gonorrhea and chlamydia. She is ready received IM ceftriaxone. We'll give azithromycin. Cultures pending. We'll discharge with prescription for Keflex for UTI. Culture is pending. We'll discharge with Diflucan in case she develops a yeast infection after being on Keflex. I feel she is safe to be discharged home. We'll discharge with prescription for ibuprofen for pain as well. Discussed return precautions and supportive care instructions. She verbalized understanding and is comfortable with plan.     Layla MawKristen N Augustin Bun, DO 04/29/14 2137

## 2014-04-30 ENCOUNTER — Encounter (HOSPITAL_COMMUNITY): Payer: Self-pay | Admitting: Family Medicine

## 2014-05-01 LAB — GC/CHLAMYDIA PROBE AMP
CT Probe RNA: NEGATIVE
GC PROBE AMP APTIMA: NEGATIVE

## 2014-05-02 LAB — URINE CULTURE: Colony Count: >=100000

## 2014-05-03 ENCOUNTER — Telehealth (HOSPITAL_COMMUNITY): Payer: Self-pay

## 2014-05-03 NOTE — ED Notes (Signed)
Post ED Visit - Positive Culture Follow-up  Culture report reviewed by antimicrobial stewardship pharmacist: []  Wes Dulaney, Pharm.D., BCPS []  Celedonio MiyamotoJeremy Frens, Pharm.D., BCPS []  Georgina PillionElizabeth Martin, Pharm.D., BCPS []  [place] SpringsMinh Pham, [address]Ph[address]D., BCPS, AAHIVP []  Estella HuskMichelle Turner, Pharm.D., BCPS, AAHIVP [x]  Babs BertinHaley Baird, [address] BoulevardPharm.D.   Positive urine culture Treated with cephalexin , organism sensitive to the same and no further patient follow-up is required at this time.  Ashley JacobsFesterman, Pola Furno C 05/03/2014, 12:12 PM

## 2014-05-16 ENCOUNTER — Inpatient Hospital Stay (HOSPITAL_COMMUNITY): Payer: Self-pay

## 2014-05-16 ENCOUNTER — Inpatient Hospital Stay (HOSPITAL_COMMUNITY)
Admission: AD | Admit: 2014-05-16 | Discharge: 2014-05-16 | Disposition: A | Discharge status: Home / Self Care | Payer: Self-pay | Source: Ambulatory Visit | Attending: Family Medicine | Admitting: Family Medicine

## 2014-05-16 ENCOUNTER — Encounter (HOSPITAL_COMMUNITY): Payer: Self-pay | Admitting: *Deleted

## 2014-05-16 DIAGNOSIS — K5900 Constipation, unspecified: Secondary | ICD-10-CM | POA: Insufficient documentation

## 2014-05-16 DIAGNOSIS — N898 Other specified noninflammatory disorders of vagina: Secondary | ICD-10-CM

## 2014-05-16 DIAGNOSIS — N949 Unspecified condition associated with female genital organs and menstrual cycle: Secondary | ICD-10-CM | POA: Insufficient documentation

## 2014-05-16 HISTORY — DX: Urinary tract infection, site not specified: N39.0

## 2014-05-16 HISTORY — DX: Other specified bacterial agents as the cause of diseases classified elsewhere: N76.0

## 2014-05-16 HISTORY — DX: Other specified bacterial agents as the cause of diseases classified elsewhere: B96.89

## 2014-05-16 LAB — URINALYSIS, ROUTINE W REFLEX MICROSCOPIC
Bilirubin Urine: NEGATIVE
Glucose, UA: NEGATIVE mg/dL
Ketones, ur: NEGATIVE mg/dL
Nitrite: NEGATIVE
PH: 6 (ref 5.0–8.0)
Protein, ur: NEGATIVE mg/dL
Urobilinogen, UA: 0.2 mg/dL (ref 0.0–1.0)

## 2014-05-16 LAB — URINE MICROSCOPIC-ADD ON

## 2014-05-16 LAB — CBC
HEMATOCRIT: 28.8 % — AB (ref 36.0–46.0)
HEMOGLOBIN: 8.4 g/dL — AB (ref 12.0–15.0)
MCH: 20.1 pg — AB (ref 26.0–34.0)
MCHC: 29.2 g/dL — ABNORMAL LOW (ref 30.0–36.0)
MCV: 68.9 fL — ABNORMAL LOW (ref 78.0–100.0)
Platelets: 227 10*3/uL (ref 150–400)
RBC: 4.18 MIL/uL (ref 3.87–5.11)
RDW: 17.8 % — ABNORMAL HIGH (ref 11.5–15.5)
WBC: 5.4 10*3/uL (ref 4.0–10.5)

## 2014-05-16 LAB — POCT PREGNANCY, URINE: Preg Test, Ur: NEGATIVE

## 2014-05-16 LAB — WET PREP, GENITAL
CLUE CELLS WET PREP: NONE SEEN
Trich, Wet Prep: NONE SEEN

## 2014-05-16 LAB — HIV ANTIBODY (ROUTINE TESTING W REFLEX): HIV: NONREACTIVE

## 2014-05-16 MED ORDER — FLUCONAZOLE 150 MG PO TABS: 150.0000 mg | ORAL_TABLET | Freq: Every day | ORAL | Status: DC

## 2014-05-16 MED ORDER — TRAMADOL HCL 50 MG PO TABS: 50.0000 mg | ORAL_TABLET | Freq: Four times a day (QID) | ORAL | Status: DC | PRN

## 2014-05-16 MED ORDER — KETOROLAC TROMETHAMINE 60 MG/2ML IM SOLN
60.0000 mg | Freq: Once | INTRAMUSCULAR | Status: AC
  Administered 2014-05-16: 60 mg via INTRAMUSCULAR
  Filled 2014-05-16: qty 2

## 2014-05-16 MED ORDER — IBUPROFEN 600 MG PO TABS: 600.0000 mg | ORAL_TABLET | Freq: Four times a day (QID) | ORAL | Status: DC | PRN

## 2014-05-16 MED ORDER — IOHEXOL 300 MG/ML  SOLN
50.0000 mL | INTRAMUSCULAR | Status: AC
  Administered 2014-05-16: 50 mL via ORAL

## 2014-05-16 MED ORDER — IOHEXOL 300 MG/ML  SOLN
100.0000 mL | Freq: Once | INTRAMUSCULAR | Status: AC | PRN
  Administered 2014-05-16: 100 mL via INTRAVENOUS

## 2014-05-16 NOTE — MAU Note (Addendum)
States was treated about a month ago for a bacterial infection, then yeast. [facility] couple of [facility] and [facility] for [facility]. States she has had vaginal pain and has continued throughout these episodes. Pain when trying to insert tampon and pain with intercourse. Notes some blood on tissue after wiping. Has tested negative for STI's. Has a lot of discharge, clear, no odor.

## 2014-05-16 NOTE — MAU Provider Note (Signed)
History     CSN: [id number]  Arrival date and time: 05/16/14 [id number]   First Provider Initiated Contact with Patient 05/16/14 [phone]      Chief Complaint  Patient presents with  . Vaginal Pain   HPI   Ms. Julia Padilla is a 38 y.o. female who presents with vaginal pain. The pain is everywhere though out her vagina and worsens with urination and intercourse. The patient had intercourse this morning and it was extremely painful. The pain was during intercourse and after intercourse.  No new sexual partners; has had the same partner for 4 years.  Patient has been treated for several presumed vaginal infections over the last few weeks due to this vaginal pain.     OB History    Gravida Para Term Preterm AB TAB SAB Ectopic Multiple Living   4 2 2  0 1 1 0 0 0 2      Past Medical History  Diagnosis Date  . No pertinent past medical history   . UTI (lower urinary tract infection)   . Bacterial vaginosis     Past Surgical History  Procedure Laterality Date  . Cesarean section    . Dilation and curettage of uterus      Family History  Problem Relation Age of Onset  . Anesthesia problems Neg Hx     History  Substance Use Topics  . Smoking status: Never Smoker   . Smokeless tobacco: Not on file  . Alcohol Use: No    Allergies: No Known Allergies  Prescriptions prior to admission  Medication Sig Dispense Refill Last Dose  . cephALEXin (KEFLEX) 500 MG capsule Take 1 capsule (500 mg total) by mouth 2 (two) times daily. 14 capsule 0   . fluconazole (DIFLUCAN) 150 MG tablet Take 1 tablet (150 mg total) by mouth once. 1 tablet 0   . ibuprofen (ADVIL,MOTRIN) 800 MG tablet Take 1 tablet (800 mg total) by mouth every 8 (eight) hours as needed for mild pain. 30 tablet 0   . metroNIDAZOLE (FLAGYL) 500 MG tablet Take 1 tablet (500 mg total) by mouth 2 (two) times daily. One po bid x 7 days 14 tablet 0   . vitamin B-12 (CYANOCOBALAMIN) 500 MCG tablet Take 500 mcg by mouth daily.    Past Week at Unknown time    Results for orders placed or performed during the hospital encounter of 05/16/14 (from the past 48 hour(s))  Urinalysis, Routine w reflex microscopic     Status: Abnormal   Collection Time: 05/16/14  8:42 AM  Result Value Ref Range   Color, Urine YELLOW YELLOW   APPearance CLOUDY (A) CLEAR   Specific Gravity, Urine >1.030 (H) 1.005 - 1.030   pH 6.0 5.0 - 8.0   Glucose, UA NEGATIVE NEGATIVE mg/dL   Hgb urine dipstick TRACE (A) NEGATIVE   Bilirubin Urine NEGATIVE NEGATIVE   Ketones, ur NEGATIVE NEGATIVE mg/dL   Protein, ur NEGATIVE NEGATIVE mg/dL   Urobilinogen, UA 0.2 0.0 - 1.0 mg/dL   Nitrite NEGATIVE NEGATIVE   Leukocytes, UA LARGE (A) NEGATIVE  Urine microscopic-add on     Status: Abnormal   Collection Time: 05/16/14  8:42 AM  Result Value Ref Range   Squamous Epithelial / LPF FEW (A) RARE   WBC, UA 21-50 <3 WBC/hpf   RBC / HPF 0-2 <3 RBC/hpf   Bacteria, UA FEW (A) RARE   Urine-Other MUCOUS PRESENT   Pregnancy, urine POC     Status: None  Collection Time: 05/16/14  9:07 AM  Result Value Ref Range   Preg Test, Ur NEGATIVE NEGATIVE    Comment:        THE SENSITIVITY OF THIS METHODOLOGY IS >24 mIU/mL   Wet prep, genital     Status: Abnormal   Collection Time: 05/16/14  9:15 AM  Result Value Ref Range   Yeast Wet Prep HPF POC RARE (A) NONE SEEN   Trich, Wet Prep NONE SEEN NONE SEEN   Clue Cells Wet Prep HPF POC NONE SEEN NONE SEEN   WBC, Wet Prep HPF POC FEW (A) NONE SEEN    Comment: FEW BACTERIA SEEN  CBC     Status: Abnormal   Collection Time: 05/16/14  9:25 AM  Result Value Ref Range   WBC 5.4 4.0 - 10.5 K/uL   RBC 4.18 3.87 - 5.11 MIL/uL   Hemoglobin 8.4 (L) 12.0 - 15.0 g/dL   HCT 56.228.8 (L) 13.036.0 - 86.546.0 %   MCV 68.9 (L) 78.0 - 100.0 fL   MCH 20.1 (L) 26.0 - 34.0 pg   MCHC 29.2 (L) 30.0 - 36.0 g/dL   RDW 78.417.8 (H) 69.611.5 - 29.515.5 %   Platelets 227 150 - 400 K/uL   Ct Pelvis W Contrast  05/16/2014   CLINICAL DATA:  Dyspareunia. Pelvic  pain. Recent urinary tract infection and treatment for bacterial vaginosis.  EXAM: CT PELVIS WITH CONTRAST  TECHNIQUE: Multidetector CT imaging of the pelvis was performed using the standard protocol following the bolus administration of intravenous contrast.  CONTRAST:  100mL OMNIPAQUE IOHEXOL 300 MG/ML  SOLN  COMPARISON:  None.  FINDINGS: Appendix normal. Uterus 10.5 cm in length as measured along the endometrium, 7.8 cm transverse and 5.8 cm anterior-posterior. Small nabothian cyst, image 72 series 603.  2.6 by 2.2 cm complex cystic left ovarian lesion with enhancing margins, possibly a corpus luteum or collapsed complex cyst. Ovaries otherwise unremarkable. No significant free pelvic fluid.  Within the vagina or along the vaginal wall, there is a 2.9 by 2.8 by 1.5 cm hypodense lesion just to the right of midline, internal density approximately 22 Hounsfield units.  Endometrium difficult to accurately measure but probably about 1.3 cm.  Review of the scout image demonstrates prominence of stool throughout the colon.  IMPRESSION: 1. The dominant finding is a complex hypodense structure slightly eccentric to the right in the vagina. 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 If further workup confirms a Gartner duct cyst, it might be prudent to obtain a renal ultrasound to rule out associated abnormality of the metanephric urinary system (renal dysplasia, renal agenesis, or cross fused ectopia). 2.  Prominent stool throughout the colon favors constipation.   Electronically Signed   By: Herbie BaltimoreWalt  Liebkemann M.D.   On: 05/16/2014 11:27    Review of Systems  Constitutional: Negative for fever and chills.  Gastrointestinal: Negative for abdominal pain.  Genitourinary: Positive for dysuria. Negative for hematuria.       +vaginal pain.  + vaginal discharge    Physical Exam   Blood pressure 117/69,  pulse 102, temperature 98.2 F (36.8 C), temperature source Oral, resp. rate 18, height 5\' 6"  (1.676 m), weight 77.111 kg (170 lb), last menstrual period 04/29/2014.  Physical Exam  Constitutional: She is oriented to person, place, and time. She appears well-developed and well-nourished. No distress.  HENT:  Head: Normocephalic.  Eyes: Pupils are equal,  round, and reactive to light.  Neck: Neck supple.  GI: Soft. She exhibits no distension. There is no tenderness.  Genitourinary:  Speculum exam: Vagina - Small amount of creamy discharge, no odor Cervix - No contact bleeding Bimanual exam: Cervix closed, no CMT  3 cm, fluctuant cyst noted on the right side of vaginal wall. Tender to touch.  Large amount of stool palpated  Uterus non tender, normal size GC/Chlam, wet prep done Chaperone present for exam.   Musculoskeletal: Normal range of motion.  Neurological: She is alert and oriented to person, place, and time.  Skin: Skin is warm. She is not diaphoretic.  Psychiatric: Her behavior is normal.    MAU Course  Procedures  None  MDM Consulted with Dr. Adrian Blackwater regarding large right vaginal wall cyst Toradol 60 mg IM CBC Pelvic CT  Discussed CT scan results with Dr. 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 Follow up in the clinic; patient may need marsupialization Return to MAU if symptoms worsen    Iona Hansen Tykeisha Peer, NP 05/16/2014 7:42 PM

## 2014-05-16 NOTE — Discharge Instructions (Signed)

## 2014-05-17 LAB — GC/CHLAMYDIA PROBE AMP
CT PROBE, AMP APTIMA: NEGATIVE
GC PROBE AMP APTIMA: NEGATIVE

## 2014-05-28 ENCOUNTER — Encounter: Payer: Self-pay | Admitting: Obstetrics & Gynecology

## 2014-05-28 ENCOUNTER — Ambulatory Visit (INDEPENDENT_AMBULATORY_CARE_PROVIDER_SITE_OTHER): Payer: Self-pay | Admitting: Obstetrics & Gynecology

## 2014-05-28 VITALS — BP 110/72 | HR 90 | Temp 98.0°F | Ht 66.0 in | Wt 171.6 lb

## 2014-05-28 DIAGNOSIS — Q505 Embryonic cyst of broad ligament: Secondary | ICD-10-CM

## 2014-05-28 DIAGNOSIS — Q524 Other congenital malformations of vagina: Secondary | ICD-10-CM

## 2014-05-28 MED ORDER — IBUPROFEN 800 MG PO TABS: 800.0000 mg | ORAL_TABLET | Freq: Three times a day (TID) | ORAL | Status: DC | PRN

## 2014-05-28 MED ORDER — OXYCODONE-ACETAMINOPHEN 5-325 MG PO TABS: 1.0000 | ORAL_TABLET | ORAL | Status: DC | PRN

## 2014-05-28 NOTE — Progress Notes (Signed)
   Subjective:    Patient ID: Julia Padilla, female    DOB: [birth year], 38 y.o.   MRN: [id number]  HPI  Ms Duffy RhodyStanley is here because of a month of severe vaginal pain along with discharge. 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 She has been unable to have sex due to the pain.  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 Soledad GerlachSam Lentz who has seen these in the past and he is willing to see her.

## 2014-11-29 ENCOUNTER — Emergency Department (HOSPITAL_COMMUNITY)
Admission: EM | Admit: 2014-11-29 | Discharge: 2014-11-29 | Disposition: A | Discharge status: Home / Self Care | Payer: 59 | Attending: Emergency Medicine | Admitting: Emergency Medicine

## 2014-11-29 ENCOUNTER — Encounter (HOSPITAL_COMMUNITY): Payer: Self-pay

## 2014-11-29 DIAGNOSIS — H66002 Acute suppurative otitis media without spontaneous rupture of ear drum, left ear: Secondary | ICD-10-CM | POA: Insufficient documentation

## 2014-11-29 DIAGNOSIS — Z87440 Personal history of urinary (tract) infections: Secondary | ICD-10-CM | POA: Insufficient documentation

## 2014-11-29 DIAGNOSIS — H9202 Otalgia, left ear: Secondary | ICD-10-CM | POA: Diagnosis present

## 2014-11-29 DIAGNOSIS — Z8742 Personal history of other diseases of the female genital tract: Secondary | ICD-10-CM | POA: Diagnosis not present

## 2014-11-29 DIAGNOSIS — J029 Acute pharyngitis, unspecified: Secondary | ICD-10-CM | POA: Insufficient documentation

## 2014-11-29 MED ORDER — AMOXICILLIN 250 MG PO CAPS
500.0000 mg | ORAL_CAPSULE | Freq: Once | ORAL | Status: AC
  Administered 2014-11-29: 500 mg via ORAL
  Filled 2014-11-29: qty 2

## 2014-11-29 MED ORDER — IBUPROFEN 600 MG PO TABS: 600.0000 mg | ORAL_TABLET | Freq: Four times a day (QID) | ORAL | Status: DC | PRN

## 2014-11-29 MED ORDER — AMOXICILLIN 500 MG PO CAPS: 500.0000 mg | ORAL_CAPSULE | Freq: Three times a day (TID) | ORAL | Status: AC

## 2014-11-29 MED ORDER — IBUPROFEN 800 MG PO TABS
800.0000 mg | ORAL_TABLET | Freq: Once | ORAL | Status: AC
  Administered 2014-11-29: 800 mg via ORAL
  Filled 2014-11-29: qty 2

## 2014-11-29 NOTE — ED Notes (Signed)
Pt c/o left earache x 2 hours.

## 2014-11-29 NOTE — ED Provider Notes (Signed)
CSN: [id number]     Arrival date & time 11/29/14  1537 History   First MD Initiated Contact with Patient 11/29/14 1612     Chief Complaint  Patient presents with  . Otalgia     (Consider location/radiation/quality/duration/timing/severity/associated sxs/prior Treatment) Patient is a 39 y.o. female presenting with ear pain. The history is provided by the patient.  Otalgia Location:  Left Behind ear:  No abnormality Quality:  Aching and sharp Severity:  Moderate Onset quality:  Sudden Duration:  2 hours Timing:  Constant Progression:  Worsening Chronicity:  New Context: not foreign body in ear and no water in ear   Relieved by:  Nothing Worsened by:  Nothing tried Ineffective treatments:  OTC medications Associated symptoms: congestion, hearing loss, rhinorrhea and sore throat   Associated symptoms: no abdominal pain, no cough, no ear discharge, no fever, no headaches, no neck pain, no rash and no tinnitus     Past Medical History  Diagnosis Date  . No pertinent past medical history   . UTI (lower urinary tract infection)   . Bacterial vaginosis    Past Surgical History  Procedure Laterality Date  . Cesarean section    . Dilation and curettage of uterus     Family History  Problem Relation Age of Onset  . Anesthesia problems Neg Hx    History  Substance Use Topics  . Smoking status: Never Smoker   . Smokeless tobacco: Not on file  . Alcohol Use: No   OB History    Gravida Para Term Preterm AB TAB SAB Ectopic Multiple Living   4 2 2  0 1 1 0 0 0 2     Review of Systems  Constitutional: Negative for fever and chills.  HENT: Positive for congestion, ear pain, hearing loss, rhinorrhea and sore throat. Negative for ear discharge, sinus pressure, tinnitus, trouble swallowing and voice change.   Eyes: Negative for discharge.  Respiratory: Negative for cough, shortness of breath, wheezing and stridor.   Cardiovascular: Negative for chest pain.  Gastrointestinal:  Negative for abdominal pain.  Genitourinary: Negative.   Musculoskeletal: Negative for neck pain.  Skin: Negative for rash.  Neurological: Negative for headaches.      Allergies  Review of patient's allergies indicates no known allergies.  Home Medications   Prior to Admission medications   Medication Sig Start Date End Date Taking? Authorizing Provider  amoxicillin (AMOXIL) 500 MG capsule Take 1 capsule (500 mg total) by mouth 3 (three) times daily. 11/29/14 12/09/14  Burgess AmorJulie Praneel Haisley, PA-C  cephALEXin (KEFLEX) 500 MG capsule Take 1 capsule (500 mg total) by mouth 2 (two) times daily. Patient not taking: Reported on 05/16/2014 04/29/14   Kristen N Ward, DO  fluconazole (DIFLUCAN) 150 MG tablet Take 1 tablet (150 mg total) by mouth once. Patient not taking: Reported on 05/16/2014 04/29/14   Kristen N Ward, DO  fluconazole (DIFLUCAN) 150 MG tablet Take 1 tablet (150 mg total) by mouth daily. Patient not taking: Reported on 05/28/2014 05/16/14   Duane LopeJennifer I Rasch, NP  ibuprofen (ADVIL,MOTRIN) 600 MG tablet Take 1 tablet (600 mg total) by mouth every 6 (six) hours as needed. 11/29/14   Burgess AmorJulie Elasha Tess, PA-C  metroNIDAZOLE (FLAGYL) 500 MG tablet Take 1 tablet (500 mg total) by mouth 2 (two) times daily. One po bid x 7 days Patient not taking: Reported on 05/16/2014 03/20/14   Junious SilkHannah Merrell, PA-C  oxyCODONE-acetaminophen (PERCOCET/ROXICET) 5-325 MG per tablet Take 1 tablet by mouth every 4 (four) hours as needed  for severe pain. 05/28/14   Allie Bossier, MD  traMADol (ULTRAM) 50 MG tablet Take 1 tablet (50 mg total) by mouth every 6 (six) hours as needed. Patient not taking: Reported on 05/28/2014 05/16/14   Duane Lope, NP  vitamin B-12 (CYANOCOBALAMIN) 500 MCG tablet Take 500 mcg by mouth daily.    Historical Provider, MD   BP 122/81 mmHg  Pulse 94  Temp(Src) 99 F (37.2 C) (Oral)  Resp 20  Ht  (1.676 m)  Wt 180 lb (81.647 kg)  BMI 29.07 kg/m2  SpO2 100%  LMP 11/11/2014 Physical Exam   Constitutional: She is oriented to person, place, and time. She appears well-developed and well-nourished.  HENT:  Head: Normocephalic and atraumatic.  Right Ear: Tympanic membrane and ear canal normal.  Left Ear: Ear canal normal. No drainage. No mastoid tenderness. Tympanic membrane is injected, erythematous and bulging.  Nose: Mucosal edema and rhinorrhea present.  Mouth/Throat: Uvula is midline, oropharynx is clear and moist and mucous membranes are normal. No oropharyngeal exudate, posterior oropharyngeal edema, posterior oropharyngeal erythema or tonsillar abscesses.  Eyes: Conjunctivae are normal.  Cardiovascular: Normal rate and normal heart sounds.   Pulmonary/Chest: Effort normal. No respiratory distress. She has no wheezes. She has no rales.  Abdominal: Soft. There is no tenderness.  Musculoskeletal: Normal range of motion.  Neurological: She is alert and oriented to person, place, and time.  Skin: Skin is warm and dry. No rash noted.  Psychiatric: She has a normal mood and affect.    ED Course  Procedures (including critical care time) Labs Review Labs Reviewed - No data to display  Imaging Review No results found.   EKG Interpretation None      MDM   Final diagnoses:  Acute suppurative otitis media of left ear without spontaneous rupture of tympanic membrane, recurrence not specified    Amoxil, ibuprofen, first doses given here.  Resource guide given for establishing pcp. Prn f/u here for any worsened sx.  She is taking dayquill for congestions, advised to continued.    Burgess Amor, PA-C 11/29/14 1629  Bethann Berkshire, MD 11/29/14 2239

## 2014-11-29 NOTE — Discharge Instructions (Signed)
Otitis Media With Effusion Otitis media with effusion is the presence of fluid in the middle ear. This is a common problem in children, which often follows ear infections. It may be present for weeks or longer after the infection. Unlike an acute ear infection, otitis media with effusion refers only to fluid behind the ear drum and not infection. Children with repeated ear and sinus infections and allergy problems are the most likely to get otitis media with effusion. CAUSES  The most frequent cause of the fluid buildup is dysfunction of the eustachian tubes. These are the tubes that drain fluid in the ears to the back of the nose (nasopharynx). SYMPTOMS   The main symptom of this condition is hearing loss. As a result, you or your child may:  Listen to the TV at a loud volume.  Not respond to questions.  Ask "what" often when spoken to.  Mistake or confuse one sound or word for another.  There may be a sensation of fullness or pressure but usually not pain. DIAGNOSIS   Your health care provider will diagnose this condition by examining you or your child's ears.  Your health care provider may test the pressure in you or your child's ear with a tympanometer.  A hearing test may be conducted if the problem persists. TREATMENT   Treatment depends on the duration and the effects of the effusion.  Antibiotics, decongestants, nose drops, and cortisone-type drugs (tablets or nasal spray) may not be helpful.  Children with persistent ear effusions may have delayed language or behavioral problems. Children at risk for developmental delays in hearing, learning, and speech may require referral to a specialist earlier than children not at risk.  You or your child's health care provider may suggest a referral to an ear, nose, and throat surgeon for treatment. The following may help restore normal hearing:  Drainage of fluid.  Placement of ear tubes (tympanostomy tubes).  Removal of adenoids  (adenoidectomy). HOME CARE INSTRUCTIONS   Avoid secondhand smoke.  Infants who are breastfed are less likely to have this condition.  Avoid feeding infants while they are lying flat.  Avoid known environmental allergens.  Avoid people who are sick. SEEK MEDICAL CARE IF:   Hearing is not better in 3 months.  Hearing is worse.  Ear pain.  Drainage from the ear.  Dizziness. MAKE SURE YOU:   Understand these instructions.  Will watch your condition.  Will get help right away if you are not doing well or get worse. Document Released: 07/23/2004 Document Revised: 10/30/2013 Document Reviewed: 01/10/2013 [facility] Patient Information 2015 [place], [place]. This information is not intended to replace advice given to you by your health care provider. Make sure you discuss any questions you have with your health care provider.    Emergency Department Resource Guide 1) Find a Doctor and Pay Out of Pocket Although you won't have to find out who is covered by your insurance plan, it is a good idea to ask around and get recommendations. You will then need to call the office and see if the doctor you have chosen will accept you as a new patient and what types of options they offer for patients who are self-pay. Some doctors offer discounts or will set up payment plans for their patients who do not have insurance, but you will need to ask so you aren't surprised when you get to your appointment.  2) Contact Your Local Health Department Not all health departments have doctors that can  see patients for sick visits, but many do, so it is worth a call to see if yours does. If you don't know where your local health department is, you can check in your phone book. The CDC also has a tool to help you locate your state's health department, and many state websites also have listings of all of their local health departments.  3) Find a Walk-in Clinic If your illness is not likely to be very severe or  complicated, you may want to try a walk in clinic. These are popping up all over the country in pharmacies, drugstores, and shopping centers. They're usually staffed by nurse practitioners or physician assistants that have been trained to treat common illnesses and complaints. They're usually fairly quick and inexpensive. However, if you have serious medical issues or chronic medical problems, these are probably not your best option.  No Primary Care Doctor: - Call Health Connect at  [phone] - they can help you locate a primary care doctor that  accepts your insurance, provides certain services, etc. - Physician Referral Service- [phone]  Chronic Pain Problems: Organization         Address  Phone   Notes  [facility]  [phone] Patients need to be referred by their primary care doctor.   Medication Assistance: Organization         Address  Phone   Notes  [facility] Medication [facility] [address] [place], [place] [address] [phone] --Must be a resident of [facility] -- Must have NO insurance coverage whatsoever (no Medicaid/ Medicare, etc.) -- The pt. MUST have a primary care doctor that directs their care regularly and follows them in the community   MedAssist  [phone]   [employer]  [phone]    Agencies that provide inexpensive medical care: Organization         Address  Phone   Notes  [facility]  [phone]   [facility]    [phone]   [facility] [address] [place], [place] [address] [phone]   [facility] [address] [place] [phone]   Planned Parenthood    [phone]   [facility]    [phone]   Community Health and [facility]  [address], [place] Phone:  [phone], Fax:  [phone] Hours of Operation:  9 am - 6 pm, M-F.  Also accepts  Medicaid/Medicare and self-pay.  [facility]  [address], [place] Phone: [phone], Fax: [phone]. Hours of Operation:  8:30 am - 5:30 pm, M-F.  Also accepts Medicaid and self-pay.  [facility] [place] [address], [place] Phone: [phone]   [facility] [address] Natasha Bence [place], [place] [phone], Ext. [phone] Mondays & Thursdays: 7-9 AM.  First 15 patients are seen on a first come, first serve basis.    Medicaid-accepting [facility] Providers:  Organization         Address  Phone   Notes  [facility] [address], [place] [phone] Also accepts self-pay patients.  [facility] [address] Laurell Josephs [place], [place]  [phone]   [facility] [address], [place] [phone]   [facility] [address] [place] [address],  [place] [phone]   Renaye Rakers [address], [place]   [phone] Only accepts [place] patients after they have their name applied to their card.   Self-Pay (no insurance) in [facility]:  Organization         Address  Phone   Notes  Sickle Cell Patients, [facility] [address] [place] [employer], [place] [phone]   [facility] [address] [place], [place] [phone]   [facility]  [address], [place] [phone]   [facility]/Dr. Osei-Bonsu  [address] [place] or [address], [place] [phone] Phone number for both [place] and [place] locations is the same.  [facility]gent Medical and [facility] [address], [place] [phone]   [facility] [address], [place] or [address] [phone]   [facility] [address], [place] [phone], phone; [phone], fax Sees patients 1st and 3rd Saturday of every month.  Must not qualify for public or private insurance (i.e. Medicaid, Medicare, [place] Health Choice, Veterans' Benefits)  Household income should be no more than 200% of the poverty level The clinic cannot treat you if you are pregnant or think you are pregnant  Sexually transmitted diseases are not treated at the clinic.    Dental Care: Organization         Address  Phone  Notes  [facility] Department of [facility] [address] [place], [place] [phone] Accepts children up to age 77 who are enrolled in [place] or [place] Health Choice; pregnant women with a Medicaid card; and children who have applied for Medicaid or [place] Health Choice, but were declined, whose parents can pay a reduced fee at time of service.  [facility] Department of [facility]  [address], [place] [phone] Accepts children up to age 70 who are enrolled in [place] or [place] Health Choice; pregnant women with a Medicaid card; and children who have applied for Medicaid or [place] Health Choice, but were declined, whose parents can pay a reduced fee at time of service.  [facility]  [address] [place], [place] [phone] Patients are seen by appointment only. Walk-ins are not accepted. [facility] will see patients 36 years of age and older. Monday - Tuesday (8am-5pm) Most Wednesdays (8:30-5pm) $30 per visit, cash only  [facility] Adult [facility] Access [facility]  [address], [facility] [phone] Patients are seen by appointment only. Walk-ins are not accepted. [facility] will see patients 5 years of age and older. One Wednesday Evening (Monthly: Volunteer Based).  $30 per visit, cash only  [employer]  [phone] for adults; Children under age 96, call Graduate Pediatric Dentistry at [phone]. Children aged  29-14, please call [phone] to request a pediatric application.  Dental services are provided in all areas of dental care including fillings, crowns and bridges, complete and partial dentures, implants, gum treatment, root canals, and extractions. Preventive care is also provided. Treatment is provided to both adults and children. Patients are selected via a lottery and there is often a waiting list.   [facility] [address] [place]  [phone] [email or web address]   [facility] [address],  [place], [place] [phone], Ext. [phone] Second and Fourth Thursday of each month, opens at 6:30 AM; Clinic ends at 9 AM.  Patients are seen on a first-come first-served basis, and a limited number are seen during each clinic.   [facility]  [address], [place], [place] [phone]   Eligibility Requirements You must have lived in [place], [place], or [place] counties for at least the last three months.   You cannot be eligible for state or federal sponsored [place]healthcare insurance, including CIG[employer]eterans Administration, [place]icaid, or [employer]icare.   You generally cannot be eligible for healthcare insurance through your employer.    How to apply: Eligibility screenings are held every Tuesday and Wednesday afternoon from 1:00 pm until 4:00 pm. You do not need an appointment for the interview!  [facility] [address], [place], [place] [phone]   [facility]  [phone]   [facility]  [phone]   [facility]  [phone]    Behavioral Health Resources in the Community: Intensive Outpatient Programs Organization         Address  Phone  Notes  [facility] [address], [place], [place] [phone]   [facility] [address], [place], [place] [phone]   [facility] [address],  [place], [place]  [phone]   [facility] [address],  [place], [place] [phone] or [phone]   Substance Abuse Resources Organization         Address  Phone  Notes  [facility]  [phone]   [facility]  [phone]   [facility]  [phone]   [facility]  [phone]   Residential & Outpatient Substance Abuse Program  [phone]   Psychological Services Organization         Address  Phone  Notes  [facility]  [phone]- [phone]   [facility]  [phone]- [phone]   [facility] [address], [place] [phone] or [phone]    Mobile Crisis Teams Organization         Address  Phone  Notes  [facility], Mobile Crisis Care Unit  [phone]   [facility]  [address] [place], [place] [phone]   Doristine LocksSharon DeEsch [address] [place] [phone]    Self-Help/Support Groups Organization         Address  Phone             Notes  Mental Health Assoc. of  - variety of support groups  336- [phone] Call for more information  Narcotics Anonymous (NA), [facility] [address], [place]  2 meetings at this location   Statisticianesidential Treatment Programs Organization         Address  Phone  Notes  [facility] [address],    [place]  [phone]   [facility]  [address], [place] [phone], [place], [place] [phone]   [facility] [address], [place] [phone] Admissions: 8am-3pm M-F  [facility] [address]    [place], [place] [phone]   [facility] [address], [place], [place] [phone]   [facility] [address]  [place], [place] [phone]   [facility] - Intensive Outpatient [address], [place], [place] [phone]  [facility]  ([facility]iction [facility] [address]  [place], [place] [phone] or [phone]   [facility] ([facility]) [address], [place], [place] [phone] Accepts Medicaid  [facility] [address],  [place] [phone] Substance Abuse/Addiction Treatment   [facility] Organization         Address  Phone  Notes  [facility]  [phone]   Angie Fava, PhD [address] Ervin Knack [place], [place]   [phone] or [phone]   [facility] Behavioral   [address] [place], [place] [phone]   [facility] [address] [place], [place] [phone] Insurance/Medicaid/sponsorship through [facility] and Families [address]                                    [place], [place] [phone] Therapy/tele-psych/case  [facility] [address], [place] [phone]    Dr. Lolly Mustache  [phone]   [facility]  [employer] [facility] Dept. 1) [address] [place] 2) [address], [place] 3)  [address], [place] [phone]  [phone]   [facility] Child Abuse Hotline [phone] or [phone] (After Hours)

## 2015-04-03 ENCOUNTER — Other Ambulatory Visit (HOSPITAL_COMMUNITY)
Admission: RE | Admit: 2015-04-03 | Discharge: 2015-04-03 | Disposition: A | Discharge status: Home / Self Care | Payer: 59 | Source: Ambulatory Visit | Attending: Obstetrics and Gynecology | Admitting: Obstetrics and Gynecology

## 2015-04-03 ENCOUNTER — Other Ambulatory Visit: Payer: Self-pay | Admitting: Obstetrics and Gynecology

## 2015-04-03 ENCOUNTER — Ambulatory Visit (INDEPENDENT_AMBULATORY_CARE_PROVIDER_SITE_OTHER): Payer: 59 | Admitting: Obstetrics and Gynecology

## 2015-04-03 ENCOUNTER — Encounter: Payer: Self-pay | Admitting: Obstetrics and Gynecology

## 2015-04-03 VITALS — BP 110/76 | Ht 67.0 in | Wt 177.0 lb

## 2015-04-03 DIAGNOSIS — Z1151 Encounter for screening for human papillomavirus (HPV): Secondary | ICD-10-CM | POA: Insufficient documentation

## 2015-04-03 DIAGNOSIS — M545 Low back pain, unspecified: Secondary | ICD-10-CM

## 2015-04-03 DIAGNOSIS — Z01419 Encounter for gynecological examination (general) (routine) without abnormal findings: Secondary | ICD-10-CM

## 2015-04-03 DIAGNOSIS — N83209 Unspecified ovarian cyst, unspecified side: Secondary | ICD-10-CM

## 2015-04-03 NOTE — Progress Notes (Signed)
Patient ID: Julia Padilla, female   DOB: [birth year], 39 y.o.   MRN: [id number]  By signing my name below, I, Jarvis Morgan, attest that this documentation has been prepared under the direction and in the presence of Tilda Burrow, MD. Electronically Signed: Jarvis Morgan, ED Scribe. 04/03/2015. 9:31 AM.  Assessment:  Annual Gyn Exam Right vag sidewall gardners duct cyst Hx left ov cyst 2015 followed in past at [facility]    Plan:  1. pap smear done, next pap due 3 year 2. return annually or prn for cyst 3    Annual mammogram advised starting at 42 4 will do baseline u/s of left ovary due to hx Subjective:  Julia Padilla is a 39 y.o. female [phone] who presents for annual exam. Patient's last menstrual period was 03/06/2015. The patient has no complaints today  The following portions of the patient's history were reviewed and updated as appropriate: allergies, current medications, past family history, past medical history, past social history, past surgical history and problem list. Past Medical History  Diagnosis Date  . No pertinent past medical history   . UTI (lower urinary tract infection)   . Bacterial vaginosis   . Ovarian cyst     Past Surgical History  Procedure Laterality Date  . Cesarean section    . Dilation and curettage of uterus       Current outpatient prescriptions:  .  ibuprofen (ADVIL,MOTRIN) 600 MG tablet, Take 1 tablet (600 mg total) by mouth every 6 (six) hours as needed., Disp: 30 tablet, Rfl: 0  Review of Systems Constitutional: negative Gastrointestinal: negative Genitourinary: negative  Objective:  BP 110/76 mmHg  Ht  (1.702 m)  Wt 177 lb (80.287 kg)  BMI 27.72 kg/m2  LMP 03/06/2015   BMI: Body mass index is 27.72 kg/(m^2).  General Appearance: Alert, appropriate appearance for age. No acute distress HEENT: Grossly normal Neck / Thyroid:  Breast Exam: No masses or nodes.No dimpling, nipple retraction or  discharge. Gastrointestinal: Soft, non-tender, no masses or organomegaly Pelvic Exam: Vulva and vagina appear normal. Bimanual exam reveals normal uterus and adnexa. 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  Urinalysis: negative  Plan  as above  Christin Bach. MD Pgr [phone] 9:31 AM  I personally performed the services described in this documentation, which was SCRIBED in my presence. The recorded information has been reviewed and considered accurate. It has been edited as necessary during review. Tilda Burrow, MD

## 2015-04-03 NOTE — Progress Notes (Signed)
Patient ID: Julia Padilla, female   DOB: August 26, 1975, 39 y.o.   MRN: [id number] Pt here today for her annual exam. Pt denies any problems or concerns at this time. Pt states that she is having lower back pain and wanted urine checked. Urine dipped and was negative.

## 2015-04-04 ENCOUNTER — Encounter: Payer: Self-pay | Admitting: *Deleted

## 2015-04-04 ENCOUNTER — Other Ambulatory Visit: Payer: 59

## 2015-04-04 LAB — CYTOLOGY - PAP

## 2015-04-08 ENCOUNTER — Ambulatory Visit: Payer: 59

## 2015-07-23 ENCOUNTER — Ambulatory Visit: Payer: Self-pay | Admitting: Obstetrics & Gynecology

## 2016-07-08 ENCOUNTER — Ambulatory Visit: Payer: 59 | Admitting: Family Medicine

## 2016-07-09 ENCOUNTER — Ambulatory Visit (INDEPENDENT_AMBULATORY_CARE_PROVIDER_SITE_OTHER): Payer: BLUE CROSS/BLUE SHIELD | Admitting: Family Medicine

## 2016-07-09 ENCOUNTER — Encounter: Payer: Self-pay | Admitting: Family Medicine

## 2016-07-09 VITALS — BP 110/64 | HR 76 | Temp 97.9°F | Resp 16 | Ht 67.0 in | Wt 192.0 lb

## 2016-07-09 DIAGNOSIS — R635 Abnormal weight gain: Secondary | ICD-10-CM | POA: Diagnosis not present

## 2016-07-09 DIAGNOSIS — Z13220 Encounter for screening for lipoid disorders: Secondary | ICD-10-CM

## 2016-07-09 DIAGNOSIS — Z131 Encounter for screening for diabetes mellitus: Secondary | ICD-10-CM | POA: Diagnosis not present

## 2016-07-09 DIAGNOSIS — D509 Iron deficiency anemia, unspecified: Secondary | ICD-10-CM | POA: Diagnosis not present

## 2016-07-09 DIAGNOSIS — Z7689 Persons encountering health services in other specified circumstances: Secondary | ICD-10-CM

## 2016-07-09 HISTORY — DX: Iron deficiency anemia, unspecified: D50.9

## 2016-07-09 NOTE — Patient Instructions (Signed)
Continue to eat well Walk every day that you are able Labs ordered fasting I will send you a letter with your test results.  If there is anything of concern, we will call right away. See me yearly

## 2016-07-09 NOTE — Progress Notes (Signed)
Chief Complaint  Patient presents with  . Establish Care   Patient is new to establish care. GYN visit October 2016, chart is reviewed. Pap up to date. Patient refuses flu shot. No ongoing medical problems. Review her record indicates she has a history of iron deficiency anemia, CBC not checked for years. Her only complaint is weight gain. She's gained 20 pounds in the last year, and feels she has not changed her eating or exercise habits.   Patient Active Problem List   Diagnosis Date Noted  . Anemia, iron deficiency 07/09/2016    Outpatient Encounter Prescriptions as of 07/09/2016  Medication Sig  . ibuprofen (ADVIL,MOTRIN) 600 MG tablet Take 1 tablet (600 mg total) by mouth every 6 (six) hours as needed.   No facility-administered encounter medications on file as of 07/09/2016.     Past Medical History:  Diagnosis Date  . Anemia, iron deficiency 07/09/2016  . Bacterial vaginosis   . No pertinent past medical history   . Ovarian cyst   . UTI (lower urinary tract infection)     Past Surgical History:  Procedure Laterality Date  . CESAREAN SECTION     2000 and 2010  . DILATION AND CURETTAGE OF UTERUS      Social History   Social History  . Marital status: Divorced    Spouse name: engaged -Chief Operating OfficerMarcellus  . Number of children: 2  . Years of education: 12   Occupational History  . sales     [employer]   Social History Main Topics  . Smoking status: Never Smoker  . Smokeless tobacco: Never Used  . Alcohol use No  . Drug use: No  . Sexual activity: Yes    Birth control/ protection: Condom   Other Topics Concern  . Not on file   Social History Narrative   Divorced   Two children   Engaged to W. R. BerkleyMarcellus Geographical information systems officer([place])    Family History  Problem Relation Age of Onset  . Thyroid disease Maternal Aunt   . Thyroid disease Maternal Grandmother   . Stroke Maternal Grandmother   . Heart disease Maternal Grandmother   . Hyperlipidemia Maternal Grandmother   .  Hypertension Maternal Grandmother   . Cancer Maternal Grandfather     skin  . Anesthesia problems Neg Hx     Review of Systems  Constitutional: Negative for chills, fever and weight loss.  HENT: Negative for congestion and hearing loss.   Eyes: Negative for blurred vision and pain.  Respiratory: Negative for cough and shortness of breath.   Cardiovascular: Negative for chest pain and leg swelling.  Gastrointestinal: Negative for abdominal pain, constipation, diarrhea and heartburn.  Genitourinary: Negative for dysuria and frequency.  Musculoskeletal: Negative for falls, joint pain and myalgias.  Neurological: Negative for dizziness, seizures and headaches.  Endo/Heme/Allergies: Negative for environmental allergies and polydipsia. Does not bruise/bleed easily.       No constipation, heat or cold intolerance, changes in skin and hair  Psychiatric/Behavioral: Negative for depression. The patient is not nervous/anxious and does not have insomnia.     BP 110/64 (BP Location: Right Arm, Patient Position: Sitting, Cuff Size: Normal)   Pulse 76   Temp 97.9 F (36.6 C) (Oral)   Resp 16   Ht 5\' 7"  (1.702 m)   Wt 192 lb 0.6 oz (87.1 kg)   LMP 07/02/2016 (Exact Date)   SpO2 100%   BMI 30.08 kg/m   Physical Exam  Constitutional: She is oriented to person, place, and time.  She appears well-developed and well-nourished.  HENT:  Head: Normocephalic and atraumatic.  Right Ear: External ear normal.  Left Ear: External ear normal.  Mouth/Throat: Oropharynx is clear and moist.  Eyes: Conjunctivae are normal. Pupils are equal, round, and reactive to light.  Neck: Normal range of motion. Neck supple. No thyromegaly present.  Cardiovascular: Normal rate, regular rhythm and normal heart sounds.   Pulmonary/Chest: Effort normal and breath sounds normal. No respiratory distress.  Abdominal: Soft. Bowel sounds are normal.  Musculoskeletal: Normal range of motion. She exhibits no edema.    Lymphadenopathy:    She has no cervical adenopathy.  Neurological: She is alert and oriented to person, place, and time. She displays normal reflexes.  Gait normal. Normal reflexes  Skin: Skin is warm and dry.  Psychiatric: She has a normal mood and affect. Her behavior is normal. Thought content normal.  Nursing note and vitals reviewed.  ASSESSMENT/PLAN:  1. Encounter to establish care with new doctor   2. Iron deficiency anemia, unspecified iron deficiency anemia type   3. Weight gain   4. Screening for diabetes mellitus   5. Screening, lipid  - TSH - Lipid panel - Glucose - CBC with Differential - IBC panel   Patient Instructions  Continue to eat well Walk every day that you are able Labs ordered fasting I will send you a letter with your test results.  If there is anything of concern, we will call right away. See me yearly    Eustace Moore, MD

## 2016-07-14 ENCOUNTER — Encounter: Payer: Self-pay | Admitting: Family Medicine

## 2016-07-14 ENCOUNTER — Ambulatory Visit (INDEPENDENT_AMBULATORY_CARE_PROVIDER_SITE_OTHER): Payer: BLUE CROSS/BLUE SHIELD | Admitting: Family Medicine

## 2016-07-14 VITALS — BP 102/50 | HR 92 | Temp 98.2°F | Resp 18 | Ht 67.0 in | Wt 194.0 lb

## 2016-07-14 DIAGNOSIS — J208 Acute bronchitis due to other specified organisms: Secondary | ICD-10-CM | POA: Diagnosis not present

## 2016-07-14 MED ORDER — BENZONATATE 100 MG PO CAPS: 100.0000 mg | ORAL_CAPSULE | Freq: Three times a day (TID) | ORAL | 0 refills | Status: DC | PRN

## 2016-07-14 NOTE — Progress Notes (Signed)
    Chief Complaint  Patient presents with  . URI    x 4 days  Cough for 4 d Mild runny nose and congestion Body aches and low grade fever No chest pain, no sputum, no sore throat or headache. Patient did not get a flu shot No underlying lung disease asthma or COPD, nonsmoker    Patient Active Problem List   Diagnosis Date Noted  . Anemia, iron deficiency 07/09/2016    Outpatient Encounter Prescriptions as of 07/14/2016  Medication Sig  . ibuprofen (ADVIL,MOTRIN) 600 MG tablet Take 1 tablet (600 mg total) by mouth every 6 (six) hours as needed.  . benzonatate (TESSALON) 100 MG capsule Take 1 capsule (100 mg total) by mouth 3 (three) times daily as needed for cough.   No facility-administered encounter medications on file as of 07/14/2016.     No Known Allergies  Review of Systems  Constitutional: Positive for activity change, appetite change, diaphoresis and fatigue.       Viral infection  HENT: Positive for congestion and rhinorrhea.        Mild  Eyes: Negative for redness and visual disturbance.  Respiratory: Positive for cough. Negative for shortness of breath and wheezing.        Harsh, continual cough, nonproductive  Cardiovascular: Negative for chest pain.  Gastrointestinal: Negative for abdominal pain, nausea and vomiting.  Musculoskeletal: Negative for arthralgias and myalgias.  Neurological: Negative for dizziness and headaches.  Psychiatric/Behavioral: Positive for sleep disturbance.    BP (!) 102/50 (BP Location: Right Arm, Patient Position: Sitting, Cuff Size: Normal)   Pulse 92   Temp 98.2 F (36.8 C) (Oral)   Resp 18   Ht 5\' 7"  (1.702 m)   Wt 194 lb (88 kg)   LMP 07/02/2016 (Exact Date)   SpO2 100%   BMI 30.38 kg/m   Physical Exam  Constitutional: She is oriented to person, place, and time. She appears well-developed and well-nourished.  Appears fatigued  HENT:  Head: Normocephalic and atraumatic.  Right Ear: External ear normal.  Left Ear:  External ear normal.  Mouth/Throat: Oropharynx is clear and moist.  Clear mucus. Tonsil pillars mildly red  Eyes: Conjunctivae are normal. Pupils are equal, round, and reactive to light.  Neck: Normal range of motion. Neck supple. No thyromegaly present.  Cardiovascular: Normal rate, regular rhythm and normal heart sounds.   Pulmonary/Chest: Effort normal and breath sounds normal. No respiratory distress. She has no wheezes.  Lungs are clear  Abdominal: Soft. Bowel sounds are normal.  Musculoskeletal: Normal range of motion. She exhibits no edema.  Lymphadenopathy:    She has cervical adenopathy.  Neurological: She is alert and oriented to person, place, and time.  Gait normal  Skin: Skin is warm and dry.  Psychiatric: She has a normal mood and affect. Her behavior is normal. Thought content normal.  Nursing note and vitals reviewed.   ASSESSMENT/PLAN:  1. Acute viral bronchitis    Patient Instructions  Take mucinex DM twice a day Take the tessalon three times a day This should help control the cough The cough can take 4 weeks to go away completely May use in addition acetaminophen, ibuprofen or naproxen for discomfort    Eustace MooreYvonne Sue Carsyn Taubman, MD

## 2016-07-14 NOTE — Patient Instructions (Signed)
Take mucinex DM twice a day Take the tessalon three times a day This should help control the cough The cough can take 4 weeks to go away completely May use in addition acetaminophen, ibuprofen or naproxen for discomfort

## 2016-08-05 LAB — CBC WITH DIFFERENTIAL/PLATELET
BASOS PCT: 0 %
Basophils Absolute: 0 cells/uL (ref 0–200)
EOS ABS: 74 {cells}/uL (ref 15–500)
Eosinophils Relative: 2 %
HCT: 28.1 % — ABNORMAL LOW (ref 35.0–45.0)
Hemoglobin: 8.2 g/dL — ABNORMAL LOW (ref 11.7–15.5)
LYMPHS PCT: 28 %
Lymphs Abs: 1036 cells/uL (ref 850–3900)
MCH: 19.8 pg — ABNORMAL LOW (ref 27.0–33.0)
MCHC: 29.2 g/dL — ABNORMAL LOW (ref 32.0–36.0)
MCV: 67.9 fL — AB (ref 80.0–100.0)
MONOS PCT: 8 %
MPV: 9.2 fL (ref 7.5–12.5)
Monocytes Absolute: 296 cells/uL (ref 200–950)
Neutro Abs: 2294 cells/uL (ref 1500–7800)
Neutrophils Relative %: 62 %
Platelets: 340 10*3/uL (ref 140–400)
RBC: 4.14 MIL/uL (ref 3.80–5.10)
RDW: 17.3 % — AB (ref 11.0–15.0)
WBC: 3.7 10*3/uL — AB (ref 3.8–10.8)

## 2016-08-05 LAB — LIPID PANEL
CHOL/HDL RATIO: 4.1 ratio (ref <5.0)
Cholesterol: 157 mg/dL (ref <200)
HDL: 38 mg/dL — ABNORMAL LOW (ref >50)
LDL CALC: 107 mg/dL — AB (ref <100)
Triglycerides: 58 mg/dL (ref <150)
VLDL: 12 mg/dL (ref <30)

## 2016-08-05 LAB — GLUCOSE, RANDOM: Glucose, Bld: 100 mg/dL — ABNORMAL HIGH (ref 65–99)

## 2016-08-05 LAB — IRON AND TIBC
%SAT: 3 % — ABNORMAL LOW (ref 11–50)
Iron: 10 ug/dL — ABNORMAL LOW (ref 40–190)
TIBC: 341 ug/dL (ref 250–450)
UIBC: 331 ug/dL (ref 125–400)

## 2016-08-06 ENCOUNTER — Encounter: Payer: Self-pay | Admitting: Family Medicine

## 2016-08-06 LAB — TSH: TSH: 1.26 mIU/L

## 2016-10-07 ENCOUNTER — Telehealth: Payer: Self-pay | Admitting: Family Medicine

## 2016-10-07 NOTE — Telephone Encounter (Signed)
No.  She needs to get her blood count better first, THEN we can discuss

## 2016-10-07 NOTE — Telephone Encounter (Signed)
Julia Padilla is asking if Dr. Delton See would please call her in some kind of diet pill, please advise?

## 2016-10-09 NOTE — Telephone Encounter (Signed)
Pt aware.

## 2017-07-02 ENCOUNTER — Ambulatory Visit: Payer: BLUE CROSS/BLUE SHIELD | Admitting: Family Medicine

## 2017-12-03 ENCOUNTER — Encounter: Payer: Self-pay | Admitting: Family Medicine

## 2018-01-17 ENCOUNTER — Ambulatory Visit (INDEPENDENT_AMBULATORY_CARE_PROVIDER_SITE_OTHER): Payer: BLUE CROSS/BLUE SHIELD | Admitting: Adult Health

## 2018-01-17 ENCOUNTER — Encounter: Payer: Self-pay | Admitting: Adult Health

## 2018-01-17 VITALS — BP 141/80 | HR 94 | Ht 67.0 in | Wt 220.0 lb

## 2018-01-17 DIAGNOSIS — R110 Nausea: Secondary | ICD-10-CM | POA: Diagnosis not present

## 2018-01-17 DIAGNOSIS — Z3A01 Less than 8 weeks gestation of pregnancy: Secondary | ICD-10-CM | POA: Insufficient documentation

## 2018-01-17 DIAGNOSIS — O3680X0 Pregnancy with inconclusive fetal viability, not applicable or unspecified: Secondary | ICD-10-CM | POA: Insufficient documentation

## 2018-01-17 DIAGNOSIS — O34219 Maternal care for unspecified type scar from previous cesarean delivery: Secondary | ICD-10-CM | POA: Insufficient documentation

## 2018-01-17 DIAGNOSIS — O09521 Supervision of elderly multigravida, first trimester: Secondary | ICD-10-CM | POA: Insufficient documentation

## 2018-01-17 DIAGNOSIS — Z3201 Encounter for pregnancy test, result positive: Secondary | ICD-10-CM | POA: Diagnosis not present

## 2018-01-17 DIAGNOSIS — N926 Irregular menstruation, unspecified: Secondary | ICD-10-CM | POA: Diagnosis not present

## 2018-01-17 LAB — POCT URINE PREGNANCY: PREG TEST UR: POSITIVE — AB

## 2018-01-17 MED ORDER — PNV PRENATAL PLUS MULTIVITAMIN 27-1 MG PO TABS: ORAL_TABLET | ORAL | 12 refills | Status: DC

## 2018-01-17 NOTE — Patient Instructions (Signed)
First Trimester of Pregnancy The first trimester of pregnancy is from week 1 until the end of week 13 (months 1 through 3). A week after a sperm fertilizes an egg, the egg will implant on the wall of the uterus. This embryo will begin to develop into a baby. Genes from you and your partner will form the baby. The female genes will determine whether the baby will be a boy or a girl. At 6-8 weeks, the eyes and face will be formed, and the heartbeat can be seen on ultrasound. At the end of 12 weeks, all the baby's organs will be formed. Now that you are pregnant, you will want to do everything you can to have a healthy baby. Two of the most important things are to get good prenatal care and to follow your health care provider's instructions. Prenatal care is all the medical care you receive before the baby's birth. This care will help prevent, find, and treat any problems during the pregnancy and childbirth. Body changes during your first trimester Your body goes through many changes during pregnancy. The changes vary from woman to woman.  You may gain or lose a couple of pounds at first.  You may feel sick to your stomach (nauseous) and you may throw up (vomit). If the vomiting is uncontrollable, call your health care provider.  You may tire easily.  You may develop headaches that can be relieved by medicines. All medicines should be approved by your health care provider.  You may urinate more often. Painful urination may mean you have a bladder infection.  You may develop heartburn as a result of your pregnancy.  You may develop constipation because certain hormones are causing the muscles that push stool through your intestines to slow down.  You may develop hemorrhoids or swollen veins (varicose veins).  Your breasts may begin to grow larger and become tender. Your nipples may stick out more, and the tissue that surrounds them (areola) may become darker.  Your gums may bleed and may be  sensitive to brushing and flossing.  Dark spots or blotches (chloasma, mask of pregnancy) may develop on your face. This will likely fade after the baby is born.  Your menstrual periods will stop.  You may have a loss of appetite.  You may develop cravings for certain kinds of food.  You may have changes in your emotions from day to day, such as being excited to be pregnant or being concerned that something may go wrong with the pregnancy and baby.  You may have more vivid and strange dreams.  You may have changes in your hair. These can include thickening of your hair, rapid growth, and changes in texture. Some women also have hair loss during or after pregnancy, or hair that feels dry or thin. Your hair will most likely return to normal after your baby is born.  What to expect at prenatal visits During a routine prenatal visit:  You will be weighed to make sure you and the baby are growing normally.  Your blood pressure will be taken.  Your abdomen will be measured to track your baby's growth.  The fetal heartbeat will be listened to between weeks 10 and 14 of your pregnancy.  Test results from any previous visits will be discussed.  Your health care provider may ask you:  How you are feeling.  If you are feeling the baby move.  If you have had any abnormal symptoms, such as leaking fluid, bleeding, severe headaches,   or abdominal cramping.  If you are using any tobacco products, including cigarettes, chewing tobacco, and electronic cigarettes.  If you have any questions.  Other tests that may be performed during your first trimester include:  Blood tests to find your blood type and to check for the presence of any previous infections. The tests will also be used to check for low iron levels (anemia) and protein on red blood cells (Rh antibodies). Depending on your risk factors, or if you previously had diabetes during pregnancy, you may have tests to check for high blood  sugar that affects pregnant women (gestational diabetes).  Urine tests to check for infections, diabetes, or protein in the urine.  An ultrasound to confirm the proper growth and development of the baby.  Fetal screens for spinal cord problems (spina bifida) and Down syndrome.  HIV (human immunodeficiency virus) testing. Routine prenatal testing includes screening for HIV, unless you choose not to have this test.  You may need other tests to make sure you and the baby are doing well.  Follow these instructions at home: Medicines  Follow your health care provider's instructions regarding medicine use. Specific medicines may be either safe or unsafe to take during pregnancy.  Take a prenatal vitamin that contains at least 600 micrograms (mcg) of folic acid.  If you develop constipation, try taking a stool softener if your health care provider approves. Eating and drinking  Eat a balanced diet that includes fresh fruits and vegetables, whole grains, good sources of protein such as meat, eggs, or tofu, and low-fat dairy. Your health care provider will help you determine the amount of weight gain that is right for you.  Avoid raw meat and uncooked cheese. These carry germs that can cause birth defects in the baby.  Eating four or five small meals rather than three large meals a day may help relieve nausea and vomiting. If you start to feel nauseous, eating a few soda crackers can be helpful. Drinking liquids between meals, instead of during meals, also seems to help ease nausea and vomiting.  Limit foods that are high in fat and processed sugars, such as fried and sweet foods.  To prevent constipation: ? Eat foods that are high in fiber, such as fresh fruits and vegetables, whole grains, and beans. ? Drink enough fluid to keep your urine clear or pale yellow. Activity  Exercise only as directed by your health care provider. Most women can continue their usual exercise routine during  pregnancy. Try to exercise for 30 minutes at least 5 days a week. Exercising will help you: ? Control your weight. ? Stay in shape. ? Be prepared for labor and delivery.  Experiencing pain or cramping in the lower abdomen or lower back is a good sign that you should stop exercising. Check with your health care provider before continuing with normal exercises.  Try to avoid standing for long periods of time. Move your legs often if you must stand in one place for a long time.  Avoid heavy lifting.  Wear low-heeled shoes and practice good posture.  You may continue to have sex unless your health care provider tells you not to. Relieving pain and discomfort  Wear a good support bra to relieve breast tenderness.  Take warm sitz baths to soothe any pain or discomfort caused by hemorrhoids. Use hemorrhoid cream if your health care provider approves.  Rest with your legs elevated if you have leg cramps or low back pain.  If you develop   varicose veins in your legs, wear support hose. Elevate your feet for 15 minutes, 3-4 times a day. Limit salt in your diet. Prenatal care  Schedule your prenatal visits by the twelfth week of pregnancy. They are usually scheduled monthly at first, then more often in the last 2 months before delivery.  Write down your questions. Take them to your prenatal visits.  Keep all your prenatal visits as told by your health care provider. This is important. Safety  Wear your seat belt at all times when driving.  Make a list of emergency phone numbers, including numbers for family, friends, the hospital, and police and fire departments. General instructions  Ask your health care provider for a referral to a local prenatal education class. Begin classes no later than the beginning of month 6 of your pregnancy.  Ask for help if you have counseling or nutritional needs during pregnancy. Your health care provider can offer advice or refer you to specialists for help  with various needs.  Do not use hot tubs, steam rooms, or saunas.  Do not douche or use tampons or scented sanitary pads.  Do not cross your legs for long periods of time.  Avoid cat litter boxes and soil used by cats. These carry germs that can cause birth defects in the baby and possibly loss of the fetus by miscarriage or stillbirth.  Avoid all smoking, herbs, alcohol, and medicines not prescribed by your health care provider. Chemicals in these products affect the formation and growth of the baby.  Do not use any products that contain nicotine or tobacco, such as cigarettes and e-cigarettes. If you need help quitting, ask your health care provider. You may receive counseling support and other resources to help you quit.  Schedule a dentist appointment. At home, brush your teeth with a soft toothbrush and be gentle when you floss. Contact a health care provider if:  You have dizziness.  You have mild pelvic cramps, pelvic pressure, or nagging pain in the abdominal area.  You have persistent nausea, vomiting, or diarrhea.  You have a bad smelling vaginal discharge.  You have pain when you urinate.  You notice increased swelling in your face, hands, legs, or ankles.  You are exposed to fifth disease or chickenpox.  You are exposed to German measles (rubella) and have never had it. Get help right away if:  You have a fever.  You are leaking fluid from your vagina.  You have spotting or bleeding from your vagina.  You have severe abdominal cramping or pain.  You have rapid weight gain or loss.  You vomit blood or material that looks like coffee grounds.  You develop a severe headache.  You have shortness of breath.  You have any kind of trauma, such as from a fall or a car accident. Summary  The first trimester of pregnancy is from week 1 until the end of week 13 (months 1 through 3).  Your body goes through many changes during pregnancy. The changes vary from  woman to woman.  You will have routine prenatal visits. During those visits, your health care provider will examine you, discuss any test results you may have, and talk with you about how you are feeling. This information is not intended to replace advice given to you by your health care provider. Make sure you discuss any questions you have with your health care provider. Document Released: 06/09/2001 Document Revised: 05/27/2016 Document Reviewed: 05/27/2016 Elsevier Interactive Patient Education  2018 Elsevier   Inc.  

## 2018-01-17 NOTE — Progress Notes (Signed)
  Subjective:     Patient ID: Julia Padilla, female   DOB: Nov 09, 1975, 42 y.o.   MRN: [id number]  HPI Aggie CosierCrystal is a 42 year old white female in for a UPT, she has missed a period and had 4 +HPTs, this is surprise.She has 6318 and 40 year old at home(both C- sections), and turned 42 today.   Review of Systems +missed period with 4+HPTs Had nausea but it is better  Reviewed past medical,surgical, social and family history. Reviewed medications and allergies.     Objective:   Physical Exam BP (!) 141/80 (BP Location: Left Arm, Patient Position: Sitting, Cuff Size: Large)   Pulse 94   Ht 5\' 7"  (1.702 m)   Wt 220 lb (99.8 kg)   LMP 11/27/2017   BMI 34.46 kg/m UPT +, about 7+2 weeks by LMP with EDD 09/04/18.Skin warm and dry. Neck: mid line trachea, normal thyroid, good ROM, no lymphadenopathy noted. Lungs: clear to ausculation bilaterally. Cardiovascular: regular rate and rhythm.Abdomen is soft and non tender.    Assessment:     1. Pregnancy examination or test, positive result   2. Less than [redacted] weeks gestation of pregnancy   3. Encounter to determine fetal viability of pregnancy, single or unspecified fetus   4. Multigravida of advanced maternal age in first trimester   5. Pregnancy with history of cesarean section, antepartum       Plan:     Meds ordered this encounter  Medications  . Prenatal Vit-Fe Fumarate-FA (PNV PRENATAL PLUS MULTIVITAMIN) 27-1 MG TABS    Sig: Take 1 daily    Dispense:  30 tablet    Refill:  12    Order Specific Question:   Supervising Provider    Answer:   Lazaro ArmsEURE, LUTHER H [[id number]]  Dating US 7/26 at 9:30 am at [facility] Follow up in 3 weeks for new OB visit Review handouts on First trimester and by [facility]

## 2018-01-21 ENCOUNTER — Ambulatory Visit (HOSPITAL_COMMUNITY)
Admission: RE | Admit: 2018-01-21 | Discharge: 2018-01-21 | Disposition: A | Discharge status: Home / Self Care | Payer: BLUE CROSS/BLUE SHIELD | Source: Ambulatory Visit | Attending: Adult Health | Admitting: Adult Health

## 2018-01-21 ENCOUNTER — Telehealth: Payer: Self-pay | Admitting: *Deleted

## 2018-01-21 DIAGNOSIS — Z3481 Encounter for supervision of other normal pregnancy, first trimester: Secondary | ICD-10-CM | POA: Insufficient documentation

## 2018-01-21 DIAGNOSIS — Z3A01 Less than 8 weeks gestation of pregnancy: Secondary | ICD-10-CM | POA: Insufficient documentation

## 2018-01-21 DIAGNOSIS — O3680X0 Pregnancy with inconclusive fetal viability, not applicable or unspecified: Secondary | ICD-10-CM

## 2018-01-21 DIAGNOSIS — Z3687 Encounter for antenatal screening for uncertain dates: Secondary | ICD-10-CM | POA: Insufficient documentation

## 2018-01-21 NOTE — Telephone Encounter (Signed)
Patient informed she needs repeat u/s in 14 days.  Does she need HCG as well. Please advise.

## 2018-01-24 ENCOUNTER — Other Ambulatory Visit: Payer: BLUE CROSS/BLUE SHIELD

## 2018-01-24 DIAGNOSIS — O200 Threatened abortion: Secondary | ICD-10-CM

## 2018-01-24 NOTE — Telephone Encounter (Signed)
Patient informed she needs lab work per [employer]. Pt to come on her lunch break.

## 2018-01-25 LAB — BETA HCG QUANT (REF LAB): hCG Quant: 26602 m[IU]/mL

## 2018-01-25 LAB — PROGESTERONE: Progesterone: 13.6 ng/mL

## 2018-02-03 ENCOUNTER — Other Ambulatory Visit: Payer: Self-pay | Admitting: Adult Health

## 2018-02-03 DIAGNOSIS — O3680X0 Pregnancy with inconclusive fetal viability, not applicable or unspecified: Secondary | ICD-10-CM

## 2018-02-04 ENCOUNTER — Ambulatory Visit (INDEPENDENT_AMBULATORY_CARE_PROVIDER_SITE_OTHER): Payer: BLUE CROSS/BLUE SHIELD

## 2018-02-04 ENCOUNTER — Other Ambulatory Visit: Payer: Self-pay | Admitting: Adult Health

## 2018-02-04 DIAGNOSIS — O3680X0 Pregnancy with inconclusive fetal viability, not applicable or unspecified: Secondary | ICD-10-CM

## 2018-02-04 NOTE — Progress Notes (Signed)
US 5+5 wks fetal pole,no fetal heart tones,GS 27 mm,normal ovaries bilat,crl 2.3 mm,Dr Emelda FearFerguson discussed results w/ pt,f/u ultrasound 7 days

## 2018-02-09 ENCOUNTER — Ambulatory Visit: Payer: BLUE CROSS/BLUE SHIELD | Admitting: *Deleted

## 2018-02-09 ENCOUNTER — Encounter: Payer: BLUE CROSS/BLUE SHIELD | Admitting: Advanced Practice Midwife

## 2018-02-10 ENCOUNTER — Other Ambulatory Visit: Payer: Self-pay | Admitting: Obstetrics and Gynecology

## 2018-02-10 DIAGNOSIS — O3680X0 Pregnancy with inconclusive fetal viability, not applicable or unspecified: Secondary | ICD-10-CM

## 2018-02-11 ENCOUNTER — Ambulatory Visit (INDEPENDENT_AMBULATORY_CARE_PROVIDER_SITE_OTHER): Payer: BLUE CROSS/BLUE SHIELD | Admitting: Obstetrics and Gynecology

## 2018-02-11 ENCOUNTER — Ambulatory Visit (INDEPENDENT_AMBULATORY_CARE_PROVIDER_SITE_OTHER): Payer: BLUE CROSS/BLUE SHIELD

## 2018-02-11 DIAGNOSIS — O021 Missed abortion: Secondary | ICD-10-CM

## 2018-02-11 DIAGNOSIS — O3680X0 Pregnancy with inconclusive fetal viability, not applicable or unspecified: Secondary | ICD-10-CM | POA: Diagnosis not present

## 2018-02-11 MED ORDER — MISOPROSTOL 200 MCG PO TABS: 800.0000 ug | ORAL_TABLET | Freq: Once | ORAL | 1 refills | Status: DC

## 2018-02-11 NOTE — Progress Notes (Signed)
Patient ID: Julia Padilla, female   DOB: April 10, 1976, 42 y.o.   MRN: [id number]    [facility]  @DATE @            Patient name: Julia Padilla MRN [id number]  Date of birth: April 10, 1976  CC & HPI:  Julia Padilla is a 42 y.o. female presenting today for an US and her initial obstetrical visit. However, her US revealed no heart sounds. She is accompanied by the father of the baby. The patient denies nausea, fever, chills or any other symptoms or complaints at this time.  Prior u/s last week suspected missed AB, today''s u/s was to confirm. ROS:  ROS - nausea - fever - chills All systems are negative except as noted in the HPI and PMH.   Pertinent History Reviewed:   Reviewed:  Medical         Past Medical History:  Diagnosis Date  . Anemia, iron deficiency 07/09/2016  . Bacterial vaginosis   . No pertinent past medical history   . Ovarian cyst   . UTI (lower urinary tract infection)                               Surgical Hx:    Past Surgical History:  Procedure Laterality Date  . CESAREAN SECTION     2000 and 2010  . DILATION AND CURETTAGE OF UTERUS     Medications: Reviewed & Updated - see associated section                       Current Outpatient Medications:  .  Prenatal Vit-Fe Fumarate-FA (PNV PRENATAL PLUS MULTIVITAMIN) 27-1 MG TABS, Take 1 daily, Disp: 30 tablet, Rfl: 12 .  misoprostol (CYTOTEC) 200 MCG tablet, Place 4 tablets (800 mcg total) vaginally once for 1 dose., Disp: 4 tablet, Rfl: 1  Social History: Reviewed -  reports that she has never smoked. She has never used smokeless tobacco.  Objective Findings:  Vitals: Last menstrual period 11/27/2017.  PHYSICAL EXAMINATION General appearance - alert, well appearing, and in no distress, oriented to person, place, and time Mental status - alert, oriented to person, place, and time, normal mood, behavior, speech, dress, motor activity, and thought processes, affect appropriate to  mood  PELVIC DEFERRED  Assessment & Plan:   A:  1.  Miscarriage/ Missed AB 2. Rh positive status.  P:  1. Rx Cytotec 800 mcg PV , repeat in 24 hr if needed. 2. F/U in 7-10 days  By signing my name below, I, Pietro CassisEmily Tufford, attest that this documentation has been prepared under the direction and in the presence of Tilda BurrowFerguson, Jalaine Riggenbach V, MD. Electronically Signed: Pietro CassisEmily Tufford, Medical Scribe. 02/11/18. 12:26 PM.  I personally performed the services described in this documentation, which was SCRIBED in my presence. The recorded information has been reviewed and considered accurate. It has been edited as necessary during review. Tilda BurrowJohn V Kinslei Labine, MD

## 2018-02-11 NOTE — Progress Notes (Signed)
US 6+1 wks,single IUP,no fht,GS 24.6 mm=7+3 wks,crl 4.1 mm,normal ovaries bilat,mult nabothian cysts,results discussed w/Dr Emelda FearFerguson

## 2018-02-16 ENCOUNTER — Ambulatory Visit (INDEPENDENT_AMBULATORY_CARE_PROVIDER_SITE_OTHER): Payer: BLUE CROSS/BLUE SHIELD | Admitting: Obstetrics and Gynecology

## 2018-02-16 ENCOUNTER — Other Ambulatory Visit: Payer: Self-pay

## 2018-02-16 ENCOUNTER — Encounter: Payer: Self-pay | Admitting: Obstetrics and Gynecology

## 2018-02-16 VITALS — BP 125/85 | HR 79 | Ht 67.0 in | Wt 223.0 lb

## 2018-02-16 DIAGNOSIS — O039 Complete or unspecified spontaneous abortion without complication: Secondary | ICD-10-CM

## 2018-02-16 NOTE — Progress Notes (Signed)
Patient ID: Julia Padilla, female   DOB: 1976-06-27, 42 y.o.   MRN: [id number]    [facility]  @[facility] @            Patient name: Julia Padilla MRN [id number]  Date of birth: 1976-06-27  CC & HPI:  Julia Padilla is a 42 y.o. female presenting today for F/u to miscarriage. Initial u/s done on 02/04/18 showed no fetal heartbeat. Another u/s done done on 02/11/18 confirmed missed AB. Cytotec was used 8/17-18/19 but did not pass big clots and is still having vaginally bleeding.  Has no birthcontrol plans but does not want to try again to conceive while husband would like to try to conceive again.   ROS:  ROS +vaginal bleeding -fever -chills Cramping present All systems are negative except as noted in the HPI and PMH.   Pertinent History Reviewed:   Reviewed: Significant for none Medical         Past Medical History:  Diagnosis Date  . Anemia, iron deficiency 07/09/2016  . Bacterial vaginosis   . No pertinent past medical history   . Ovarian cyst   . UTI (lower urinary tract infection)                               Surgical Hx:    Past Surgical History:  Procedure Laterality Date  . CESAREAN SECTION     2000 and 2010  . DILATION AND CURETTAGE OF UTERUS     Medications: Reviewed & Updated - see associated section                       Current Outpatient Medications:  .  misoprostol (CYTOTEC) 200 MCG tablet, Place 4 tablets (800 mcg total) vaginally once for 1 dose., Disp: 4 tablet, Rfl: 1 .  Prenatal Vit-Fe Fumarate-FA (PNV PRENATAL PLUS MULTIVITAMIN) 27-1 MG TABS, Take 1 daily, Disp: 30 tablet, Rfl: 12   Social History: Reviewed -  reports that she has never smoked. She has never used smokeless tobacco.  Objective Findings:  Vitals: Last menstrual period 11/27/2017.  PHYSICAL EXAMINATION General appearance - alert, well appearing, and in no distress and oriented to person, place, and time Mental status - alert, oriented to person, place, and time,  normal mood, behavior, speech, dress, motor activity, and thought processes, depressed mood Blood type O pos. PELVIC Ring forceps were used to grasp at remaning fetal tissue.  F/u u/s confirms retiained tissue/ incomplete AB   Assessment & Plan:   A:  1.  Incomplete AB , now completed. 2. Rh pos  P:  1. F/u 1 week if bleeding continues, if ceased PRN. 2. No sexual activity until bleeding has ceased 3. [facility] discussed 3 month waiting for decision regarding future pregnancy.    By signing my name below, I, Arnette NorrisMari Johnson, attest that this documentation has been prepared under the direction and in the presence of Tilda BurrowFerguson, Aleria Maheu V, MD. Electronically Signed: Arnette NorrisMari Johnson Medical Scribe. 02/16/18. 9:57 AM.  I personally performed the services described in this documentation, which was SCRIBED in my presence. The recorded information has been reviewed and considered accurate. It has been edited as necessary during review. Tilda BurrowJohn V Maddax Palinkas, MD

## 2018-06-24 ENCOUNTER — Inpatient Hospital Stay (HOSPITAL_COMMUNITY)
Admission: AD | Admit: 2018-06-24 | Discharge: 2018-06-24 | Disposition: A | Discharge status: Home / Self Care | Payer: Medicaid Other | Attending: Obstetrics & Gynecology | Admitting: Obstetrics & Gynecology

## 2018-06-24 ENCOUNTER — Encounter (HOSPITAL_COMMUNITY): Payer: Self-pay | Admitting: *Deleted

## 2018-06-24 DIAGNOSIS — Z3201 Encounter for pregnancy test, result positive: Secondary | ICD-10-CM | POA: Insufficient documentation

## 2018-06-24 DIAGNOSIS — R300 Dysuria: Secondary | ICD-10-CM | POA: Diagnosis present

## 2018-06-24 DIAGNOSIS — R8290 Unspecified abnormal findings in urine: Secondary | ICD-10-CM | POA: Diagnosis not present

## 2018-06-24 DIAGNOSIS — O26891 Other specified pregnancy related conditions, first trimester: Secondary | ICD-10-CM

## 2018-06-24 LAB — URINALYSIS, ROUTINE W REFLEX MICROSCOPIC
BILIRUBIN URINE: NEGATIVE
Glucose, UA: NEGATIVE mg/dL
Hgb urine dipstick: NEGATIVE
KETONES UR: NEGATIVE mg/dL
Leukocytes, UA: NEGATIVE
NITRITE: NEGATIVE
PH: 7 (ref 5.0–8.0)
Protein, ur: NEGATIVE mg/dL
SPECIFIC GRAVITY, URINE: 1.012 (ref 1.005–1.030)

## 2018-06-24 LAB — POCT PREGNANCY, URINE
Preg Test, Ur: NEGATIVE
Preg Test, Ur: POSITIVE — AB

## 2018-06-24 NOTE — MAU Provider Note (Signed)
History     CSN: [id number]  Arrival date and time: 06/24/18 [id number]   First Provider Initiated Contact with Patient 06/24/18 [phone]      Chief Complaint  Patient presents with  . Dysuria   HPI  Julia IgoCrystal D Cisnero is a 42 y.o. [id number] at 5772w3d by LMP who presents to MAU with chief complaint of foul-smelling urine. She denies dysuria, abdominal pain, vaginal bleeding, fever or recent illness at time of Provider assessment.  OB History    Gravida  5   Para  2   Term  2   Preterm  0   AB  2   Living  2     SAB  1   TAB  0   Ectopic  0   Multiple  0   Live Births  2           Past Medical History:  Diagnosis Date  . Anemia, iron deficiency 07/09/2016  . Bacterial vaginosis   . No pertinent past medical history   . Ovarian cyst   . UTI (lower urinary tract infection)     Past Surgical History:  Procedure Laterality Date  . CESAREAN SECTION     2000 and 2010  . DILATION AND CURETTAGE OF UTERUS      Family History  Problem Relation Age of Onset  . Thyroid disease Maternal Aunt   . Thyroid disease Maternal Grandmother   . Stroke Maternal Grandmother   . Heart disease Maternal Grandmother   . Hyperlipidemia Maternal Grandmother   . Hypertension Maternal Grandmother   . Cancer Maternal Grandfather        skin  . Anesthesia problems Neg Hx     Social History   Tobacco Use  . Smoking status: Never Smoker  . Smokeless tobacco: Never Used  Substance Use Topics  . Alcohol use: No  . Drug use: No    Allergies: No Known Allergies  Medications Prior to Admission  Medication Sig Dispense Refill Last Dose  . misoprostol (CYTOTEC) 200 MCG tablet Place 4 tablets (800 mcg total) vaginally once for 1 dose. 4 tablet 1   . Prenatal Vit-Fe Fumarate-FA (PNV PRENATAL PLUS MULTIVITAMIN) 27-1 MG TABS Take 1 daily (Patient not taking: Reported on 02/16/2018) 30 tablet 12 Not Taking    Review of Systems  Constitutional: Negative for chills, fatigue and fever.   Gastrointestinal: Positive for nausea. Negative for abdominal pain and vomiting.  Genitourinary: Negative for difficulty urinating, dyspareunia, dysuria, flank pain, vaginal bleeding, vaginal discharge and vaginal pain.  Musculoskeletal: Negative for back pain.  Neurological: Negative for headaches.  All other systems reviewed and are negative.  Physical Exam   Blood pressure 135/71, pulse (!) 102, temperature 98.6 F (37 C), temperature source Oral, resp. rate 18, height 5\' 7"  (1.702 m), weight 99 kg, last menstrual period 05/31/2018, SpO2 100 %, unknown if currently breastfeeding.  Physical Exam  Nursing note and vitals reviewed. Constitutional: She is oriented to person, place, and time. She appears well-developed and well-nourished.  Respiratory: Effort normal.  GI: Soft. She exhibits no distension. There is no abdominal tenderness. There is no rebound and no CVA tenderness.  Neurological: She is alert and oriented to person, place, and time.  Skin: Skin is warm and dry.  Psychiatric: She has a normal mood and affect. Her behavior is normal. Judgment and thought content normal.    MAU Course/MDM   --Discussed results of UA and possible usefulness of urine culture given history of frequent  UTIs and sole chief complaint of foul-smelling urine. Patient agrees to wait for results of urine culture  Patient Vitals for the past 24 hrs:  BP Temp Temp src Pulse Resp SpO2 Height Weight  06/24/18 0946 135/71 98.6 F (37 C) Oral (!) 102 18 100 % - -  06/24/18 0941 - - - - - - 5\' 7"  (1.702 m) 99 kg    Results for orders placed or performed during the hospital encounter of 06/24/18 (from the past 24 hour(s))  Urinalysis, Routine w reflex microscopic     Status: None   Collection Time: 06/24/18  9:42 AM  Result Value Ref Range   Color, Urine YELLOW YELLOW   APPearance CLEAR CLEAR   Specific Gravity, Urine 1.012 1.005 - 1.030   pH 7.0 5.0 - 8.0   Glucose, UA NEGATIVE NEGATIVE mg/dL    Hgb urine dipstick NEGATIVE NEGATIVE   Bilirubin Urine NEGATIVE NEGATIVE   Ketones, ur NEGATIVE NEGATIVE mg/dL   Protein, ur NEGATIVE NEGATIVE mg/dL   Nitrite NEGATIVE NEGATIVE   Leukocytes, UA NEGATIVE NEGATIVE  Pregnancy, urine POC     Status: None   Collection Time: 06/24/18  9:45 AM  Result Value Ref Range   Preg Test, Ur NEGATIVE NEGATIVE  Pregnancy, urine POC     Status: Abnormal   Collection Time: 06/24/18  9:49 AM  Result Value Ref Range   Preg Test, Ur POSITIVE (A) NEGATIVE     Assessment and Plan  --42 y.o. [id number] at 3474w3d by LMP --No concerning findings on exam or UA --Discharge home in stable condition  Calvert CantorSamantha C Weinhold, CNM 06/24/2018, 10:27 AM

## 2018-06-24 NOTE — MAU Note (Signed)
Patient states she gets frequent UTIs and is now having painful urination for past few days.  Denies vaginal discharge or bleeding.  Reports she had a miscarriage in August, but is concerned because she had a +HPT yesterday.  Her LMP was 05/31/18.

## 2018-06-24 NOTE — MAU Note (Signed)
I put in a negative pregnancy test. But I then had to get several nurses to recheck behind me and the patient indeed has a positive pregnancy test.

## 2018-06-24 NOTE — Discharge Instructions (Signed)
Pregnancy and Urinary Tract Infection  What is a urinary tract infection?    A urinary tract infection (UTI) is an infection of any part of the urinary tract. This includes the kidneys, the tubes that connect your kidneys to your bladder (ureters), the bladder, and the tube that carries urine out of your body (urethra). These organs make, store, and get rid of urine in the body.   An upper UTI affects the ureters and kidneys (pyelonephritis), and a lower UTI affects the bladder (cystitis) and urethra (urethritis). Most urinary tract infections are caused by bacteria in your genital area, around the entrance to your urinary tract (urethra). These bacteria grow and cause irritation and inflammation of your urinary tract.  Why am I more likely to get a UTI during pregnancy?  You are more likely to develop a UTI during pregnancy because:  · The physical and hormonal changes your body goes through can make it easier for bacteria to get into your urinary tract.  · Your growing baby puts pressure on your uterus and can affect urine flow.  Does a UTI place my baby at risk?  An untreated UTI during pregnancy could lead to a kidney infection, which can cause health problems that could affect your baby. Possible complications of an untreated UTI include:  · Having your baby before 37 weeks of pregnancy (premature).  · Having a baby with a low birth weight.  · Developing high blood pressure during pregnancy (preeclampsia).  · Having a low hemoglobin level (anemia).  What are the symptoms of a UTI?  Symptoms of a UTI include:  · Needing to urinate right away (urgently).  · Frequent urination or passing small amounts of urine frequently.  · Pain or burning with urination.  · Blood in the urine.  · Urine that smells bad or unusual.  · Trouble urinating.  · Cloudy urine.  · Pain in the abdomen or lower back.  · Vaginal discharge.  You may also have:  · Vomiting or a decreased appetite.  · Confusion.  · Irritability or  tiredness.  · A fever.  · Diarrhea.  What are the treatment options for a UTI during pregnancy?  Treatment for this condition may include:  · Antibiotic medicines that are safe to take during pregnancy.  · Other medicines to treat less common causes of UTI.  How can I prevent a UTI?  To prevent a UTI:  · Go to the bathroom as soon as you feel the need. Do not hold urine for long periods of time.  · Always wipe from front to back after a bowel movement. Use each tissue one time when you wipe.  · Empty your bladder after sex.  · Keep your genital area dry.  · Drink 6-10 glasses of water each day.  · Do not douche or use deodorant sprays.  Contact a health care provider if:  · Your symptoms do not improve or they get worse.  · You have abnormal vaginal discharge.  Get help right away if:  · You have a fever.  · You have nausea and vomiting.  · You have back or side pain.  · You feel contractions in your uterus.  · You have lower belly pain.  · You have a gush of fluid from your vagina.  · You have blood in your urine.  Summary  · A urinary tract infection (UTI) is an infection of any part of the urinary tract, which includes the kidneys, ureters,   bladder, and urethra.  · Most urinary tract infections are caused by bacteria in your genital area, around the entrance to your urinary tract (urethra).  · You are more likely to develop a UTI during pregnancy.  · If you were prescribed an antibiotic, take it as told by your health care provider. Do not stop taking the antibiotic even if you start to feel better.  This information is not intended to replace advice given to you by your health care provider. Make sure you discuss any questions you have with your health care provider.  Document Released: 10/10/2010 Document Revised: 08/10/2017 Document Reviewed: 05/06/2015  Elsevier Interactive Patient Education © 2019 [employer]

## 2018-06-26 LAB — CULTURE, OB URINE: Culture: 50000 — AB

## 2018-06-27 ENCOUNTER — Other Ambulatory Visit: Payer: Self-pay | Admitting: Obstetrics and Gynecology

## 2018-06-27 ENCOUNTER — Telehealth: Payer: Self-pay | Admitting: Obstetrics and Gynecology

## 2018-06-27 MED ORDER — CEPHALEXIN 500 MG PO CAPS: 500.0000 mg | ORAL_CAPSULE | Freq: Four times a day (QID) | ORAL | 0 refills | Status: DC

## 2018-06-27 MED ORDER — TERCONAZOLE 0.4 % VA CREA: 1.0000 | TOPICAL_CREAM | Freq: Every day | VAGINAL | 0 refills | Status: DC

## 2018-06-27 NOTE — Telephone Encounter (Signed)
+   Urine culture. Rx for keflex. Patient informed of results and allergies and DOB confirmed.   Venia Carbonasch, Zyon Grout I, NP 06/27/2018  6:01 PM

## 2018-07-13 ENCOUNTER — Encounter: Payer: Self-pay | Admitting: Emergency Medicine

## 2018-07-13 ENCOUNTER — Emergency Department (HOSPITAL_COMMUNITY): Payer: Medicaid Other

## 2018-07-13 ENCOUNTER — Emergency Department (HOSPITAL_COMMUNITY)
Admission: EM | Admit: 2018-07-13 | Discharge: 2018-07-13 | Disposition: A | Discharge status: Home / Self Care | Payer: Medicaid Other | Attending: Emergency Medicine | Admitting: Emergency Medicine

## 2018-07-13 ENCOUNTER — Other Ambulatory Visit: Payer: Self-pay

## 2018-07-13 DIAGNOSIS — O9989 Other specified diseases and conditions complicating pregnancy, childbirth and the puerperium: Secondary | ICD-10-CM | POA: Insufficient documentation

## 2018-07-13 DIAGNOSIS — R1030 Lower abdominal pain, unspecified: Secondary | ICD-10-CM | POA: Insufficient documentation

## 2018-07-13 DIAGNOSIS — Z79899 Other long term (current) drug therapy: Secondary | ICD-10-CM | POA: Insufficient documentation

## 2018-07-13 DIAGNOSIS — Z3A01 Less than 8 weeks gestation of pregnancy: Secondary | ICD-10-CM | POA: Insufficient documentation

## 2018-07-13 DIAGNOSIS — O209 Hemorrhage in early pregnancy, unspecified: Secondary | ICD-10-CM | POA: Insufficient documentation

## 2018-07-13 LAB — HCG, QUANTITATIVE, PREGNANCY: hCG, Beta Chain, Quant, S: 49583 m[IU]/mL — ABNORMAL HIGH

## 2018-07-13 LAB — WET PREP, GENITAL
CLUE CELLS WET PREP: NONE SEEN
Sperm: NONE SEEN
TRICH WET PREP: NONE SEEN
Yeast Wet Prep HPF POC: NONE SEEN

## 2018-07-13 LAB — CBC
HCT: 35.6 % — ABNORMAL LOW (ref 36.0–46.0)
Hemoglobin: 10.3 g/dL — ABNORMAL LOW (ref 12.0–15.0)
MCH: 22.2 pg — ABNORMAL LOW (ref 26.0–34.0)
MCHC: 28.9 g/dL — ABNORMAL LOW (ref 30.0–36.0)
MCV: 76.6 fL — AB (ref 80.0–100.0)
NRBC: 0 % (ref 0.0–0.2)
Platelets: 284 10*3/uL (ref 150–400)
RBC: 4.65 MIL/uL (ref 3.87–5.11)
RDW: 18.4 % — ABNORMAL HIGH (ref 11.5–15.5)
WBC: 7.3 10*3/uL (ref 4.0–10.5)

## 2018-07-13 NOTE — ED Triage Notes (Signed)
Patient reports she is [redacted] weeks pregnant and started having vaginal bleeding yesterday. Scant amount.

## 2018-07-13 NOTE — ED Notes (Signed)
Pt to [place] at this time. Visitor advised she cannot go with pt for scan, visitor is very upset.

## 2018-07-13 NOTE — Discharge Instructions (Addendum)
Your ultrasound shows that you have a 6 week 5 day pregnancy that is in the uterus and there is a heart beat. Call Dr. Rayna Sexton office tomorrow to schedule follow up. No sex, no tampons, nothing in the vaginal until follow up.

## 2018-07-13 NOTE — ED Provider Notes (Signed)
[facility] EMERGENCY DEPARTMENT Provider Note   CSN: [id number] Arrival date & time: 07/13/18  1814     History   Chief Complaint Chief Complaint  Patient presents with  . Vaginal Bleeding    HPI Julia Padilla is a 43 y.o. [id number] @ 1574w1d gestation who presents to the ED with vaginal bleeding that started yesterday. Patient concerned because she just had a miscarriage 5 months ago. Patient reports mild cramping pain.   The history is provided by the patient. No language interpreter was used.  Vaginal Bleeding  Quality:  Bright red and spotting Severity:  Mild Duration:  24 hours Timing:  Intermittent Progression:  Unchanged Possible pregnancy: yes   Context: spontaneously   Relieved by:  None tried Worsened by:  Nothing Ineffective treatments:  None tried Associated symptoms: abdominal pain (mild cramp)   Associated symptoms: no back pain, no dysuria, no fatigue and no nausea   Risk factors: prior miscarriage     Past Medical History:  Diagnosis Date  . Anemia, iron deficiency 07/09/2016  . Bacterial vaginosis   . No pertinent past medical history   . Ovarian cyst   . UTI (lower urinary tract infection)     Patient Active Problem List   Diagnosis Date Noted  . Pregnancy examination or test, positive result 01/17/2018  . Less than [redacted] weeks gestation of pregnancy 01/17/2018  . Encounter to determine fetal viability of pregnancy 01/17/2018  . Pregnancy with history of cesarean section, antepartum 01/17/2018  . Multigravida of advanced maternal age in first trimester 01/17/2018  . Anemia, iron deficiency 07/09/2016    Past Surgical History:  Procedure Laterality Date  . CESAREAN SECTION     2000 and 2010  . DILATION AND CURETTAGE OF UTERUS       OB History    Gravida  5   Para  2   Term  2   Preterm  0   AB  2   Living  2     SAB  1   TAB  0   Ectopic  0   Multiple  1   Live Births  2            Home Medications    Prior to  Admission medications   Medication Sig Start Date End Date Taking? Authorizing Provider  cephALEXin (KEFLEX) 500 MG capsule Take 1 capsule (500 mg total) by mouth 4 (four) times daily. 06/27/18   Rasch, Victorino DikeJennifer I, NP  Prenatal Vit-Fe Fumarate-FA (PNV PRENATAL PLUS MULTIVITAMIN) 27-1 MG TABS Take 1 daily Patient not taking: Reported on 02/16/2018 01/17/18   Adline PotterGriffin, Jennifer A, NP  terconazole (TERAZOL 7) 0.4 % vaginal cream Place 1 applicator vaginally at bedtime. 06/27/18   Rasch, Harolyn RutherfordJennifer I, NP    Family History Family History  Problem Relation Age of Onset  . Thyroid disease Maternal Aunt   . Thyroid disease Maternal Grandmother   . Stroke Maternal Grandmother   . Heart disease Maternal Grandmother   . Hyperlipidemia Maternal Grandmother   . Hypertension Maternal Grandmother   . Cancer Maternal Grandfather        skin  . Anesthesia problems Neg Hx     Social History Social History   Tobacco Use  . Smoking status: Never Smoker  . Smokeless tobacco: Never Used  Substance Use Topics  . Alcohol use: No  . Drug use: No     Allergies   Patient has no known allergies.   Review of Systems  Review of Systems  Constitutional: Negative for chills and fatigue.  HENT: Negative.   Eyes: Negative for visual disturbance.  Respiratory: Negative for shortness of breath.   Cardiovascular: Negative for chest pain.  Gastrointestinal: Positive for abdominal pain (mild cramp). Negative for nausea and vomiting.  Genitourinary: Positive for vaginal bleeding. Negative for dysuria and genital sores.  Musculoskeletal: Negative for back pain.  Skin: Negative for rash.  Neurological: Negative for headaches.  Psychiatric/Behavioral: Negative for confusion.     Physical Exam Updated Vital Signs BP 135/76 (BP Location: Right Arm)   Pulse 90   Temp 98.1 F (36.7 C) (Oral)   Resp 18   Ht 5\' 7"  (1.702 m)   Wt 98.9 kg   LMP 05/31/2018   SpO2 100%   Breastfeeding Unknown   BMI 34.14  kg/m   Physical Exam Vitals signs and nursing note reviewed.  Constitutional:      General: She is not in acute distress.    Appearance: She is well-developed.  HENT:     Head: Normocephalic.     Nose: Nose normal.  Eyes:     Extraocular Movements: Extraocular movements intact.     Conjunctiva/sclera: Conjunctivae normal.  Neck:     Musculoskeletal: Neck supple.  Cardiovascular:     Rate and Rhythm: Normal rate.  Pulmonary:     Effort: Pulmonary effort is normal.  Abdominal:     Palpations: Abdomen is soft.     Tenderness: There is no abdominal tenderness.  Genitourinary:    Comments: External genitalia without lesions, scant dark blood vaginal vault. Cervix long, closed, no CMT, no adnexal mass palpated. Uterus without palpable enlargement.  Musculoskeletal: Normal range of motion.  Skin:    General: Skin is warm and dry.  Neurological:     Mental Status: She is alert and oriented to person, place, and time.  Psychiatric:        Mood and Affect: Mood normal.      ED Treatments / Results  Labs (all labs ordered are listed, but only abnormal results are displayed) Labs Reviewed  WET PREP, GENITAL - Abnormal; Notable for the following components:      Result Value   WBC, Wet Prep HPF POC MODERATE (*)    All other components within normal limits  CBC - Abnormal; Notable for the following components:   Hemoglobin 10.3 (*)    HCT 35.6 (*)    MCV 76.6 (*)    MCH 22.2 (*)    MCHC 28.9 (*)    RDW 18.4 (*)    All other components within normal limits  HCG, QUANTITATIVE, PREGNANCY - Abnormal; Notable for the following components:   hCG, Beta Chain, Quant, S 16,109 (*)    All other components within normal limits  GC/CHLAMYDIA PROBE AMP ([place]) NOT AT [facility]    Radiology Ultrasound shows a 6 week 5 day IUP with cardiac activity at 132.  Procedures Procedures (including critical care time)  Medications Ordered in ED Medications - No data to display   Initial  Impression / Assessment and Plan / ED Course  I have reviewed the triage vital signs and the nursing notes. 43 y.o. female here with bleeding in early pregnancy stable for d/c without heavy bleeding and viable IUP. No ectopic. Patient will call the [facility] office tomorrow for follow up.   Final Clinical Impressions(s) / ED Diagnoses   Final diagnoses:  Vaginal bleeding in pregnancy, first trimester    ED Discharge Orders  None       Kerrie Buffaloeese, Hope ChaplinM, [place]NP 07/13/18 2228    Raeford RazorKohut, Stephen, MD 07/20/18 1130

## 2018-07-14 ENCOUNTER — Telehealth: Payer: Self-pay | Admitting: *Deleted

## 2018-07-14 NOTE — Telephone Encounter (Signed)
Patient called and stated she was seen in the ED for bleeding, had an U/S and was found to be [redacted] weeks pregnant. Was told she needed to be seen today for f/u.  Chart was reviewed and I informed her that u/s was normal with everything visualized and HCG was good.  Advised we did not need to see her at this time but no sex for 7 days from time bleeding started but to call us back if bleeding increases.   Pt verbalized understanding with no further questions. Scheduled for new ob.

## 2018-07-15 LAB — GC/CHLAMYDIA PROBE AMP (~~LOC~~) NOT AT ARMC
CHLAMYDIA, DNA PROBE: NEGATIVE
Neisseria Gonorrhea: NEGATIVE

## 2018-07-19 ENCOUNTER — Other Ambulatory Visit: Payer: BLUE CROSS/BLUE SHIELD

## 2018-07-25 ENCOUNTER — Encounter: Payer: Self-pay | Admitting: Women's Health

## 2018-07-25 ENCOUNTER — Other Ambulatory Visit: Payer: Self-pay

## 2018-07-25 ENCOUNTER — Other Ambulatory Visit (HOSPITAL_COMMUNITY)
Admission: RE | Admit: 2018-07-25 | Discharge: 2018-07-25 | Disposition: A | Discharge status: Home / Self Care | Payer: Medicaid Other | Source: Ambulatory Visit | Attending: Obstetrics & Gynecology | Admitting: Obstetrics & Gynecology

## 2018-07-25 ENCOUNTER — Ambulatory Visit (INDEPENDENT_AMBULATORY_CARE_PROVIDER_SITE_OTHER): Payer: Self-pay | Admitting: Women's Health

## 2018-07-25 ENCOUNTER — Ambulatory Visit: Payer: Self-pay | Admitting: *Deleted

## 2018-07-25 VITALS — BP 133/83 | HR 107 | Wt 223.0 lb

## 2018-07-25 DIAGNOSIS — O34219 Maternal care for unspecified type scar from previous cesarean delivery: Secondary | ICD-10-CM

## 2018-07-25 DIAGNOSIS — Z124 Encounter for screening for malignant neoplasm of cervix: Secondary | ICD-10-CM | POA: Diagnosis not present

## 2018-07-25 DIAGNOSIS — O0991 Supervision of high risk pregnancy, unspecified, first trimester: Secondary | ICD-10-CM | POA: Diagnosis not present

## 2018-07-25 DIAGNOSIS — Z1389 Encounter for screening for other disorder: Secondary | ICD-10-CM

## 2018-07-25 DIAGNOSIS — N898 Other specified noninflammatory disorders of vagina: Secondary | ICD-10-CM

## 2018-07-25 DIAGNOSIS — O09521 Supervision of elderly multigravida, first trimester: Secondary | ICD-10-CM | POA: Diagnosis not present

## 2018-07-25 DIAGNOSIS — O0990 Supervision of high risk pregnancy, unspecified, unspecified trimester: Secondary | ICD-10-CM | POA: Diagnosis not present

## 2018-07-25 DIAGNOSIS — Z3A01 Less than 8 weeks gestation of pregnancy: Secondary | ICD-10-CM | POA: Insufficient documentation

## 2018-07-25 DIAGNOSIS — Z23 Encounter for immunization: Secondary | ICD-10-CM

## 2018-07-25 DIAGNOSIS — Z3682 Encounter for antenatal screening for nuchal translucency: Secondary | ICD-10-CM

## 2018-07-25 DIAGNOSIS — Z331 Pregnant state, incidental: Secondary | ICD-10-CM

## 2018-07-25 DIAGNOSIS — O09529 Supervision of elderly multigravida, unspecified trimester: Secondary | ICD-10-CM | POA: Insufficient documentation

## 2018-07-25 LAB — POCT URINALYSIS DIPSTICK OB
Blood, UA: NEGATIVE
Glucose, UA: NEGATIVE
Ketones, UA: NEGATIVE
Leukocytes, UA: NEGATIVE
Nitrite, UA: NEGATIVE
POC,PROTEIN,UA: NEGATIVE

## 2018-07-25 NOTE — Progress Notes (Signed)
INITIAL OBSTETRICAL VISIT Patient name: Julia Padilla MRN [id number]  Date of birth: 11/04/1975 Chief Complaint:   Initial Prenatal Visit (nauesa)  History of Present Illness:   Julia Padilla is a 43 y.o. [phone] Caucasian female at [redacted]w[redacted]d by LMP c/w 6wk u/s, with an Estimated Date of Delivery: 03/07/19 being seen today for her initial obstetrical visit.   Her obstetrical history is significant for C/S after 1.5hrs pushing w/o descent, 8lb13oz baby, then EAB, then ERCS, then SAB.   Today she reports some nausea, no vomiting, declines meds.  Patient's last menstrual period was 05/31/2018. Last pap 18yrs ago. Results were: normal Review of Systems:   Pertinent items are noted in HPI Denies cramping/contractions, leakage of fluid, vaginal bleeding, abnormal vaginal discharge w/ itching/odor/irritation, headaches, visual changes, shortness of breath, chest pain, abdominal pain, severe nausea/vomiting, or problems with urination or bowel movements unless otherwise stated above.  Pertinent History Reviewed:  Reviewed past medical,surgical, social, obstetrical and family history.  Reviewed problem list, medications and allergies. OB History  Gravida Para Term Preterm AB Living  5 2 2  0 2 2  SAB TAB Ectopic Multiple Live Births  1 0 0 0 2    # Outcome Date GA Lbr Len/2nd Weight Sex Delivery Anes PTL Lv  5 Current           4 SAB 01/2018          3 Term 03/13/09 [redacted]w[redacted]d  7 lb 13 oz (3.544 kg) F CS-LTranv Spinal N LIV  2 AB 2001          1 Term 05/21/99 [redacted]w[redacted]d  8 lb 13 oz (3.997 kg) M CS-LTranv EPI N LIV     Complications: Failure to Progress in Second Stage   Physical Assessment:   Vitals:   07/25/18 0942  BP: 133/83  Pulse: (!) 107  Weight: 223 lb (101.2 kg)  Body mass index is 34.93 kg/m.       Physical Examination:  General appearance - well appearing, and in no distress  Mental status - alert, oriented to person, place, and time  Psych:  She has a normal mood and  affect  Skin - warm and dry, normal color, no suspicious lesions noted  Chest - effort normal, all lung fields clear to auscultation bilaterally  Heart - normal rate and regular rhythm  Abdomen - soft, nontender  Extremities:  No swelling or varicosities noted  Pelvic - VULVA: normal appearing vulva with no masses, tenderness or lesions  VAGINA: normal appearing vagina with normal color and discharge, , 3-4cm cyst Rt vaginal wall, soft, fluid-filled, doesn't bother pt  CERVIX: normal appearing cervix without discharge or lesions, no CMT  Thin prep pap is done w/ HR HPV cotesting  Fetal Heart Rate (bpm): +u/s via informal transabdominal u/s  Results for orders placed or performed in visit on 07/25/18 (from the past 24 hour(s))  POC Urinalysis Dipstick OB   Collection Time: 07/25/18 10:00 AM  Result Value Ref Range   Color, UA     Clarity, UA     Glucose, UA Negative Negative   Bilirubin, UA     Ketones, UA neg    Spec Grav, UA     Blood, UA neg    pH, UA     POC,PROTEIN,UA Negative Negative, Trace, Small (1+), Moderate (2+), Large (3+), 4+   Urobilinogen, UA     Nitrite, UA neg    Leukocytes, UA Negative Negative   Appearance  Odor      Assessment & Plan:  1) High-Risk Pregnancy [id number] at [redacted]w[redacted]d with an Estimated Date of Delivery: 03/07/19   2) Initial OB visit  3) AMA> will check A1C, baby ASA @ 12wks, offered NIPT- wants at next visit  4) Prev c/s x2> wants RCS  5) Rt vaginal cyst> pt states has been there for years, doesn't bother her  Meds: No orders of the defined types were placed in this encounter.   Initial labs obtained Continue prenatal vitamins Reviewed n/v relief measures and warning s/s to report Reviewed recommended weight gain based on pre-gravid BMI Encouraged well-balanced diet Genetic Screening discussed Integrated Screen & NIPT d/t 40yo: requested Cystic fibrosis screening discussed declined Ultrasound discussed; fetal survey: requested CCNC  completed>spoke w/ [place] Flu shot today  Follow-up: Return in about 5 weeks (around 08/29/2018) for HROB, US:NT+1stIT. gave pt option of 3wks, then 2wks (for 1st IT/NT) vs 5wks- preferred 5wk f/u  Orders Placed This Encounter  Procedures  . Urine Culture  . US Fetal Nuchal Translucency Measurement  . Obstetric Panel, Including HIV  . Urinalysis, Routine w reflex microscopic  . Sickle cell screen  . Pain Management Screening Profile (10S)  . Hemoglobin A1c  . POC Urinalysis Dipstick OB    Cheral Marker CNM, [facility] 07/25/2018 10:30 AM

## 2018-07-25 NOTE — Addendum Note (Signed)
Addended by: Colen Darling on: 07/25/2018 10:36 AM   Modules accepted: Orders

## 2018-07-25 NOTE — Patient Instructions (Addendum)
Oveta Randolm Idol, I greatly value your feedback.  If you receive a survey following your visit with [place] today, we appreciate you taking the time to fill it out.  Thanks, Joellyn Haff, CNM, WHNP-BC  Begin taking a 81mg  baby aspirin daily at 12 weeks of pregnancy (2/25) to decrease risk of preeclampsia during pregnancy    Nausea & Vomiting  Have saltine crackers or pretzels by your bed and eat a few bites before you raise your head out of bed in the morning  Eat small frequent meals throughout the day instead of large meals  Drink plenty of fluids throughout the day to stay hydrated, just don't drink a lot of fluids with your meals.  This can make your stomach fill up faster making you feel sick  Do not brush your teeth right after you eat  Products with real ginger are good for nausea, like ginger ale and ginger hard candy Make sure it says made with real ginger!  Sucking on sour candy like lemon heads is also good for nausea  If your prenatal vitamins make you nauseated, take them at night so you will sleep through the nausea  Sea Bands  If you feel like you need medicine for the nausea & vomiting please let us know  If you are unable to keep any fluids or food down please let us know   Constipation  Drink plenty of fluid, preferably water, throughout the day  Eat foods high in fiber such as fruits, vegetables, and grains  Exercise, such as walking, is a good way to keep your bowels regular  Drink warm fluids, especially warm prune juice, or decaf coffee  Eat a 1/2 cup of real oatmeal (not instant), 1/2 cup applesauce, and 1/2-1 cup warm prune juice every day  If needed, you may take Colace (docusate sodium) stool softener once or twice a day to help keep the stool soft. If you are pregnant, wait until you are out of your first trimester (12-14 weeks of pregnancy)  If you still are having problems with constipation, you may take Miralax once daily as needed to help keep your  bowels regular.  If you are pregnant, wait until you are out of your first trimester (12-14 weeks of pregnancy)   First Trimester of Pregnancy The first trimester of pregnancy is from week 1 until the end of week 12 (months 1 through 3). A week after a sperm fertilizes an egg, the egg will implant on the wall of the uterus. This embryo will begin to develop into a baby. Genes from you and your partner are forming the baby. The female genes determine whether the baby is a boy or a girl. At 6-8 weeks, the eyes and face are formed, and the heartbeat can be seen on ultrasound. At the end of 12 weeks, all the baby's organs are formed.  Now that you are pregnant, you will want to do everything you can to have a healthy baby. Two of the most important things are to get good prenatal care and to follow your health care provider's instructions. Prenatal care is all the medical care you receive before the baby's birth. This care will help prevent, find, and treat any problems during the pregnancy and childbirth. BODY CHANGES Your body goes through many changes during pregnancy. The changes vary from woman to woman.   You may gain or lose a couple of pounds at first.  You may feel sick to your stomach (nauseous) and throw  up (vomit). If the vomiting is uncontrollable, call your health care provider.  You may tire easily.  You may develop headaches that can be relieved by medicines approved by your health care provider.  You may urinate more often. Painful urination may mean you have a bladder infection.  You may develop heartburn as a result of your pregnancy.  You may develop constipation because certain hormones are causing the muscles that push waste through your intestines to slow down.  You may develop hemorrhoids or swollen, bulging veins (varicose veins).  Your breasts may begin to grow larger and become tender. Your nipples may stick out more, and the tissue that surrounds them (areola) may become  darker.  Your gums may bleed and may be sensitive to brushing and flossing.  Dark spots or blotches (chloasma, mask of pregnancy) may develop on your face. This will likely fade after the baby is born.  Your menstrual periods will stop.  You may have a loss of appetite.  You may develop cravings for certain kinds of food.  You may have changes in your emotions from day to day, such as being excited to be pregnant or being concerned that something may go wrong with the pregnancy and baby.  You may have more vivid and strange dreams.  You may have changes in your hair. These can include thickening of your hair, rapid growth, and changes in texture. Some women also have hair loss during or after pregnancy, or hair that feels dry or thin. Your hair will most likely return to normal after your baby is born. WHAT TO EXPECT AT YOUR PRENATAL VISITS During a routine prenatal visit:  You will be weighed to make sure you and the baby are growing normally.  Your blood pressure will be taken.  Your abdomen will be measured to track your baby's growth.  The fetal heartbeat will be listened to starting around week 10 or 12 of your pregnancy.  Test results from any previous visits will be discussed. Your health care provider may ask you:  How you are feeling.  If you are feeling the baby move.  If you have had any abnormal symptoms, such as leaking fluid, bleeding, severe headaches, or abdominal cramping.  If you have any questions. Other tests that may be performed during your first trimester include:  Blood tests to find your blood type and to check for the presence of any previous infections. They will also be used to check for low iron levels (anemia) and Rh antibodies. Later in the pregnancy, blood tests for diabetes will be done along with other tests if problems develop.  Urine tests to check for infections, diabetes, or protein in the urine.  An ultrasound to confirm the proper  growth and development of the baby.  An amniocentesis to check for possible genetic problems.  Fetal screens for spina bifida and Down syndrome.  You may need other tests to make sure you and the baby are doing well. HOME CARE INSTRUCTIONS  Medicines  Follow your health care provider's instructions regarding medicine use. Specific medicines may be either safe or unsafe to take during pregnancy.  Take your prenatal vitamins as directed.  If you develop constipation, try taking a stool softener if your health care provider approves. Diet  Eat regular, well-balanced meals. Choose a variety of foods, such as meat or vegetable-based protein, fish, milk and low-fat dairy products, vegetables, fruits, and whole grain breads and cereals. Your health care provider will help you determine  the amount of weight gain that is right for you.  Avoid raw meat and uncooked cheese. These carry germs that can cause birth defects in the baby.  Eating four or five small meals rather than three large meals a day may help relieve nausea and vomiting. If you start to feel nauseous, eating a few soda crackers can be helpful. Drinking liquids between meals instead of during meals also seems to help nausea and vomiting.  If you develop constipation, eat more high-fiber foods, such as fresh vegetables or fruit and whole grains. Drink enough fluids to keep your urine clear or pale yellow. Activity and Exercise  Exercise only as directed by your health care provider. Exercising will help you:  Control your weight.  Stay in shape.  Be prepared for labor and delivery.  Experiencing pain or cramping in the lower abdomen or low back is a good sign that you should stop exercising. Check with your health care provider before continuing normal exercises.  Try to avoid standing for long periods of time. Move your legs often if you must stand in one place for a long time.  Avoid heavy lifting.  Wear low-heeled  shoes, and practice good posture.  You may continue to have sex unless your health care provider directs you otherwise. Relief of Pain or Discomfort  Wear a good support bra for breast tenderness.   Take warm sitz baths to soothe any pain or discomfort caused by hemorrhoids. Use hemorrhoid cream if your health care provider approves.   Rest with your legs elevated if you have leg cramps or low back pain.  If you develop varicose veins in your legs, wear support hose. Elevate your feet for 15 minutes, 3-4 times a day. Limit salt in your diet. Prenatal Care  Schedule your prenatal visits by the twelfth week of pregnancy. They are usually scheduled monthly at first, then more often in the last 2 months before delivery.  Write down your questions. Take them to your prenatal visits.  Keep all your prenatal visits as directed by your health care provider. Safety  Wear your seat belt at all times when driving.  Make a list of emergency phone numbers, including numbers for family, friends, the hospital, and police and fire departments. General Tips  Ask your health care provider for a referral to a local prenatal education class. Begin classes no later than at the beginning of month 6 of your pregnancy.  Ask for help if you have counseling or nutritional needs during pregnancy. Your health care provider can offer advice or refer you to specialists for help with various needs.  Do not use hot tubs, steam rooms, or saunas.  Do not douche or use tampons or scented sanitary pads.  Do not cross your legs for long periods of time.  Avoid cat litter boxes and soil used by cats. These carry germs that can cause birth defects in the baby and possibly loss of the fetus by miscarriage or stillbirth.  Avoid all smoking, herbs, alcohol, and medicines not prescribed by your health care provider. Chemicals in these affect the formation and growth of the baby.  Schedule a dentist appointment. At  home, brush your teeth with a soft toothbrush and be gentle when you floss. SEEK MEDICAL CARE IF:   You have dizziness.  You have mild pelvic cramps, pelvic pressure, or nagging pain in the abdominal area.  You have persistent nausea, vomiting, or diarrhea.  You have a bad smelling vaginal discharge.  You have pain with urination.  You notice increased swelling in your face, hands, legs, or ankles. SEEK IMMEDIATE MEDICAL CARE IF:   You have a fever.  You are leaking fluid from your vagina.  You have spotting or bleeding from your vagina.  You have severe abdominal cramping or pain.  You have rapid weight gain or loss.  You vomit blood or material that looks like coffee grounds.  You are exposed to [place] measles and have never had them.  You are exposed to fifth disease or chickenpox.  You develop a severe headache.  You have shortness of breath.  You have any kind of trauma, such as from a fall or a car accident. Document Released: 06/09/2001 Document Revised: 10/30/2013 Document Reviewed: 04/25/2013 [facility] Patient Information 2015 [place], [place]. This information is not intended to replace advice given to you by your health care provider. Make sure you discuss any questions you have with your health care provider.

## 2018-07-26 ENCOUNTER — Other Ambulatory Visit: Payer: Self-pay | Admitting: Women's Health

## 2018-07-26 LAB — PMP SCREEN PROFILE (10S), URINE
Amphetamine Scrn, Ur: NEGATIVE ng/mL
BARBITURATE SCREEN URINE: NEGATIVE ng/mL
BENZODIAZEPINE SCREEN, URINE: NEGATIVE ng/mL
CANNABINOIDS UR QL SCN: NEGATIVE ng/mL
Cocaine (Metab) Scrn, Ur: NEGATIVE ng/mL
Creatinine(Crt), U: 282.4 mg/dL (ref 20.0–300.0)
Methadone Screen, Urine: NEGATIVE ng/mL
OXYCODONE+OXYMORPHONE UR QL SCN: NEGATIVE ng/mL
Opiate Scrn, Ur: NEGATIVE ng/mL
Ph of Urine: 6.1 (ref 4.5–8.9)
Phencyclidine Qn, Ur: NEGATIVE ng/mL
Propoxyphene Scrn, Ur: NEGATIVE ng/mL

## 2018-07-26 LAB — OBSTETRIC PANEL, INCLUDING HIV
Antibody Screen: NEGATIVE
Basophils Absolute: 0 10*3/uL (ref 0.0–0.2)
Basos: 0 %
EOS (ABSOLUTE): 0 10*3/uL (ref 0.0–0.4)
Eos: 0 %
HIV Screen 4th Generation wRfx: NONREACTIVE
Hematocrit: 33.3 % — ABNORMAL LOW (ref 34.0–46.6)
Hemoglobin: 10.6 g/dL — ABNORMAL LOW (ref 11.1–15.9)
Hepatitis B Surface Ag: NEGATIVE
IMMATURE GRANS (ABS): 0 10*3/uL (ref 0.0–0.1)
Immature Granulocytes: 0 %
LYMPHS: 18 %
Lymphocytes Absolute: 1.1 10*3/uL (ref 0.7–3.1)
MCH: 23.7 pg — ABNORMAL LOW (ref 26.6–33.0)
MCHC: 31.8 g/dL (ref 31.5–35.7)
MCV: 74 fL — ABNORMAL LOW (ref 79–97)
MONOCYTES: 7 %
Monocytes Absolute: 0.4 10*3/uL (ref 0.1–0.9)
Neutrophils Absolute: 4.4 10*3/uL (ref 1.4–7.0)
Neutrophils: 75 %
Platelets: 308 10*3/uL (ref 150–450)
RBC: 4.48 x10E6/uL (ref 3.77–5.28)
RDW: 16.6 % — ABNORMAL HIGH (ref 11.7–15.4)
RPR Ser Ql: NONREACTIVE
Rh Factor: POSITIVE
Rubella Antibodies, IGG: 1.39 index (ref >0.99)
WBC: 5.9 10*3/uL (ref 3.4–10.8)

## 2018-07-26 LAB — URINALYSIS, ROUTINE W REFLEX MICROSCOPIC
Bilirubin, UA: NEGATIVE
Glucose, UA: NEGATIVE
Leukocytes, UA: NEGATIVE
NITRITE UA: NEGATIVE
RBC, UA: NEGATIVE
Specific Gravity, UA: >=1.03 — AB (ref 1.005–1.030)
Urobilinogen, Ur: 0.2 mg/dL (ref 0.2–1.0)
pH, UA: 6 (ref 5.0–7.5)

## 2018-07-26 LAB — HEMOGLOBIN A1C
Est. average glucose Bld gHb Est-mCnc: 100 mg/dL
Hgb A1c MFr Bld: 5.1 % (ref 4.8–5.6)

## 2018-07-26 LAB — MED LIST OPTION NOT SELECTED

## 2018-07-26 LAB — SICKLE CELL SCREEN: Sickle Cell Screen: NEGATIVE

## 2018-07-26 MED ORDER — FERROUS SULFATE 325 (65 FE) MG PO TABS: 325.0000 mg | ORAL_TABLET | Freq: Two times a day (BID) | ORAL | 3 refills | Status: DC

## 2018-07-27 LAB — URINE CULTURE

## 2018-07-28 LAB — CYTOLOGY - PAP
Chlamydia: NEGATIVE
DIAGNOSIS: NEGATIVE
Diagnosis: REACTIVE
HPV: NOT DETECTED
Neisseria Gonorrhea: NEGATIVE

## 2018-08-01 ENCOUNTER — Encounter: Payer: Self-pay | Admitting: Women's Health

## 2018-08-01 ENCOUNTER — Other Ambulatory Visit: Payer: Self-pay | Admitting: Women's Health

## 2018-08-01 DIAGNOSIS — R8271 Bacteriuria: Secondary | ICD-10-CM | POA: Insufficient documentation

## 2018-08-01 DIAGNOSIS — O9989 Other specified diseases and conditions complicating pregnancy, childbirth and the puerperium: Secondary | ICD-10-CM

## 2018-08-01 MED ORDER — PENICILLIN V POTASSIUM 500 MG PO TABS: 500.0000 mg | ORAL_TABLET | Freq: Four times a day (QID) | ORAL | 0 refills | Status: DC

## 2018-08-07 ENCOUNTER — Encounter (HOSPITAL_COMMUNITY): Payer: Self-pay | Admitting: Emergency Medicine

## 2018-08-07 ENCOUNTER — Emergency Department (HOSPITAL_COMMUNITY)
Admission: EM | Admit: 2018-08-07 | Discharge: 2018-08-07 | Disposition: A | Discharge status: Home / Self Care | Payer: Medicaid Other | Attending: Emergency Medicine | Admitting: Emergency Medicine

## 2018-08-07 ENCOUNTER — Other Ambulatory Visit: Payer: Self-pay

## 2018-08-07 DIAGNOSIS — O209 Hemorrhage in early pregnancy, unspecified: Secondary | ICD-10-CM | POA: Diagnosis not present

## 2018-08-07 DIAGNOSIS — O4690 Antepartum hemorrhage, unspecified, unspecified trimester: Secondary | ICD-10-CM

## 2018-08-07 DIAGNOSIS — O200 Threatened abortion: Secondary | ICD-10-CM | POA: Diagnosis not present

## 2018-08-07 DIAGNOSIS — Z3A01 Less than 8 weeks gestation of pregnancy: Secondary | ICD-10-CM | POA: Insufficient documentation

## 2018-08-07 DIAGNOSIS — Z79899 Other long term (current) drug therapy: Secondary | ICD-10-CM | POA: Diagnosis not present

## 2018-08-07 LAB — CBC WITH DIFFERENTIAL/PLATELET
Abs Immature Granulocytes: 0.02 10*3/uL (ref 0.00–0.07)
BASOS ABS: 0 10*3/uL (ref 0.0–0.1)
Basophils Relative: 0 %
Eosinophils Absolute: 0.1 10*3/uL (ref 0.0–0.5)
Eosinophils Relative: 1 %
HCT: 35.4 % — ABNORMAL LOW (ref 36.0–46.0)
Hemoglobin: 10.6 g/dL — ABNORMAL LOW (ref 12.0–15.0)
IMMATURE GRANULOCYTES: 0 %
Lymphocytes Relative: 19 %
Lymphs Abs: 1.4 10*3/uL (ref 0.7–4.0)
MCH: 23.5 pg — ABNORMAL LOW (ref 26.0–34.0)
MCHC: 29.9 g/dL — ABNORMAL LOW (ref 30.0–36.0)
MCV: 78.3 fL — ABNORMAL LOW (ref 80.0–100.0)
Monocytes Absolute: 0.4 10*3/uL (ref 0.1–1.0)
Monocytes Relative: 5 %
NEUTROS PCT: 75 %
Neutro Abs: 5.3 10*3/uL (ref 1.7–7.7)
Platelets: 270 10*3/uL (ref 150–400)
RBC: 4.52 MIL/uL (ref 3.87–5.11)
RDW: 18.8 % — AB (ref 11.5–15.5)
WBC: 7.1 10*3/uL (ref 4.0–10.5)
nRBC: 0 % (ref 0.0–0.2)

## 2018-08-07 LAB — BASIC METABOLIC PANEL
ANION GAP: 8 (ref 5–15)
BUN: 10 mg/dL (ref 6–20)
CO2: 23 mmol/L (ref 22–32)
Calcium: 8.8 mg/dL — ABNORMAL LOW (ref 8.9–10.3)
Chloride: 106 mmol/L (ref 98–111)
Creatinine, Ser: 0.75 mg/dL (ref 0.44–1.00)
GFR calc Af Amer: >60 mL/min (ref >60)
GFR calc non Af Amer: >60 mL/min (ref >60)
Glucose, Bld: 114 mg/dL — ABNORMAL HIGH (ref 70–99)
Potassium: 3.5 mmol/L (ref 3.5–5.1)
Sodium: 137 mmol/L (ref 135–145)

## 2018-08-07 LAB — HCG, QUANTITATIVE, PREGNANCY: hCG, Beta Chain, Quant, S: 140147 m[IU]/mL — ABNORMAL HIGH (ref <5)

## 2018-08-07 NOTE — ED Triage Notes (Signed)
[redacted] weeks pregnant Having sex  Noted bleeding  "just started" and cannot say how many pads she has used But states alot

## 2018-08-07 NOTE — ED Provider Notes (Signed)
[facility] EMERGENCY DEPARTMENT Provider Note   CSN: [id number] Arrival date & time: 08/07/18  1503     History   Chief Complaint Chief Complaint  Patient presents with  . Vaginal Bleeding    HPI Lavender Randolm IdolD Mestas is a 43 y.o. female.  Patient presents with the onset of vaginal bleeding which is more spotting at about 230 this afternoon.  Felt fine before that no pelvic pain.  No cramping.  [facility] and was deemed to be about [redacted] weeks pregnant at that time.  She was seen in the emergency department had an ultrasound done on January 25 which showed a single intrauterine pregnancy.  Patient is O+.  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  [facility] deems her high risk.     Past Medical History:  Diagnosis Date  . Anemia, iron deficiency 07/09/2016  . Bacterial vaginosis   . No pertinent past medical history   . Ovarian cyst   . UTI (lower urinary tract infection)     Patient Active Problem List   Diagnosis Date Noted  . Asymptomatic bacteriuria during pregnancy in first trimester 08/01/2018  . Supervision of high risk pregnancy, antepartum 07/25/2018  . AMA (advanced maternal age) multigravida 35+ 07/25/2018  . Vaginal cyst 07/25/2018  . Pregnancy with history of cesarean section, antepartum 01/17/2018  . Anemia, iron deficiency 07/09/2016    Past Surgical History:  Procedure Laterality Date  . CESAREAN SECTION     2000 and 2010  . DILATION AND CURETTAGE OF UTERUS       OB History    Gravida  5   Para  2   Term  2   Preterm  0   AB  2   Living  2     SAB  1   TAB  0   Ectopic  0   Multiple  0   Live Births  2            Home Medications    Prior to Admission medications   Medication Sig Start Date End Date Taking? Authorizing Provider  ferrous sulfate 325 (65 FE) MG tablet Take 1 tablet (325 mg total) by mouth 2 (two) times daily with a meal. 07/26/18   Cheral MarkerBooker, Kimberly R, CNM    penicillin v potassium (VEETID) 500 MG tablet Take 1 tablet (500 mg total) by mouth 4 (four) times daily. X 7 days 08/01/18   Cheral MarkerBooker, Kimberly R, CNM  Prenatal Vit-Fe Fumarate-FA (PNV PRENATAL PLUS MULTIVITAMIN) 27-1 MG TABS Take 1 daily 01/17/18   Adline PotterGriffin, Jennifer A, NP    Family History Family History  Problem Relation Age of Onset  . Thyroid disease Maternal Aunt   . Thyroid disease Maternal Grandmother   . Stroke Maternal Grandmother   . Heart disease Maternal Grandmother   . Hyperlipidemia Maternal Grandmother   . Hypertension Maternal Grandmother   . Cancer Maternal Grandfather        skin  . Anesthesia problems Neg Hx     Social History Social History   Tobacco Use  . Smoking status: Never Smoker  . Smokeless tobacco: Never Used  Substance Use Topics  . Alcohol use: No  . Drug use: No     Allergies   Patient has no known allergies.   Review of Systems Review of Systems  Constitutional: Negative for chills and fever.  HENT: Negative for congestion, rhinorrhea and sore throat.   Eyes:  Negative for visual disturbance.  Respiratory: Negative for cough and shortness of breath.   Cardiovascular: Negative for chest pain and leg swelling.  Gastrointestinal: Negative for abdominal pain, diarrhea, nausea and vomiting.  Genitourinary: Positive for vaginal bleeding. Negative for dysuria and pelvic pain.  Musculoskeletal: Negative for back pain and neck pain.  Skin: Negative for rash.  Neurological: Negative for dizziness, syncope, light-headedness and headaches.  Hematological: Does not bruise/bleed easily.  Psychiatric/Behavioral: Negative for confusion.     Physical Exam Updated Vital Signs BP 135/78 (BP Location: Right Arm)   Pulse (!) 107   Temp 98.1 F (36.7 C) (Oral)   Resp 18   Ht 1.702 m (5\' 7" )   Wt 98.9 kg   LMP 05/31/2018   SpO2 100% Comment: Simultaneous filing. User may not have seen previous data.  BMI 34.14 kg/m   Physical Exam Vitals signs  and nursing note reviewed.  Constitutional:      General: She is not in acute distress.    Appearance: She is well-developed.  HENT:     Head: Normocephalic and atraumatic.  Eyes:     Extraocular Movements: Extraocular movements intact.     Conjunctiva/sclera: Conjunctivae normal.     Pupils: Pupils are equal, round, and reactive to light.  Neck:     Musculoskeletal: Neck supple.  Cardiovascular:     Rate and Rhythm: Normal rate and regular rhythm.     Heart sounds: No murmur.  Pulmonary:     Effort: Pulmonary effort is normal. No respiratory distress.     Breath sounds: Normal breath sounds.  Abdominal:     Palpations: Abdomen is soft.     Tenderness: There is no abdominal tenderness. There is no guarding.  Musculoskeletal: Normal range of motion.  Skin:    General: Skin is warm and dry.  Neurological:     Mental Status: She is alert.      ED Treatments / Results  Labs (all labs ordered are listed, but only abnormal results are displayed) Labs Reviewed  HCG, QUANTITATIVE, PREGNANCY - Abnormal; Notable for the following components:      Result Value   hCG, Beta Chain, Quant, S 140,147 (*)    All other components within normal limits  CBC WITH DIFFERENTIAL/PLATELET - Abnormal; Notable for the following components:   Hemoglobin 10.6 (*)    HCT 35.4 (*)    MCV 78.3 (*)    MCH 23.5 (*)    MCHC 29.9 (*)    RDW 18.8 (*)    All other components within normal limits  BASIC METABOLIC PANEL - Abnormal; Notable for the following components:   Glucose, Bld 114 (*)    Calcium 8.8 (*)    All other components within normal limits    EKG None  Radiology No results found.  Procedures Procedures (including critical care time)  Medications Ordered in ED Medications - No data to display   Initial Impression / Assessment and Plan / ED Course  I have reviewed the triage vital signs and the nursing notes.  Pertinent labs & imaging results that were available during my  care of the patient were reviewed by me and considered in my medical decision making (see chart for details).    Patient's heart rate was slightly tachycardic.  Patient's blood counts CBC basic metabolic panel without any significant abnormalities.  No significant change in the hemoglobin patient has not had any heavy bleeding.  Just spotting.  Fetal heart tones were not obtainable.  Will  be tried.  Patient's quantitative hCG is obviously a lot higher than it was the end of January.  Made it clear to patient that does not rule in or rule out the concern for miscarriage.  Recommend that she follow [facility] later this week for repeat quantitative hCG.  Patient's blood type is O+.  So no concerns for needing RhoGam.  Did recommend doing pelvic exam told her that we could see if any tissue was passing through the cervical office.  Patient opted not to want that.  All she wanted was the fetal heart tones.  Patient will [facility].  Patient will return for any new or worse symptoms.  Her symptoms here today were not associated with any pelvic pain or abdominal pain.  No back pain.    Final Clinical Impressions(s) / ED Diagnoses   Final diagnoses:  Vaginal bleeding in pregnancy  Threatened miscarriage    ED Discharge Orders    None       Vanetta Mulders, MD 08/07/18 1739

## 2018-08-07 NOTE — Discharge Instructions (Signed)
Call on [facility] for follow-up later this week.  As we discussed need at least 48 hours before the pregnancy hormone number can be repeated.  Return for any new or worse symptoms.

## 2018-08-09 ENCOUNTER — Encounter: Payer: Self-pay | Admitting: Women's Health

## 2018-08-09 ENCOUNTER — Ambulatory Visit (INDEPENDENT_AMBULATORY_CARE_PROVIDER_SITE_OTHER): Payer: Self-pay | Admitting: Women's Health

## 2018-08-09 VITALS — BP 124/79 | HR 92 | Wt 228.0 lb

## 2018-08-09 DIAGNOSIS — O09521 Supervision of elderly multigravida, first trimester: Secondary | ICD-10-CM

## 2018-08-09 DIAGNOSIS — Z3A10 10 weeks gestation of pregnancy: Secondary | ICD-10-CM

## 2018-08-09 DIAGNOSIS — O0991 Supervision of high risk pregnancy, unspecified, first trimester: Secondary | ICD-10-CM

## 2018-08-09 DIAGNOSIS — N930 Postcoital and contact bleeding: Secondary | ICD-10-CM

## 2018-08-09 NOTE — Progress Notes (Signed)
   Work-in HIGH-RISK PREGNANCY VISIT Patient name: Julia Padilla MRN [id number]  Date of birth: [birth year] Chief Complaint:   w/i (fetal heart tones)  History of Present Illness:   Julia Padilla is a 43 y.o. [id number] female at [redacted]w[redacted]d with an Estimated Date of Delivery: 03/07/19 being seen today for ongoing management of a high-risk pregnancy complicated by AMA.  Today she is being seen as a work-in for recent postcoital spotting, was seen at [facility] 2/9, did not check FHT, did HCG. Spotting has since resolved, however she states she was told she needed to repeat HCG. She has a verified IUP w/ +FCA on 1/27. She was worked in initially just for [employer] since not done at ED visit, Tish unable to get w/ doppler, so she was added to my schedule and an informal transabdominal u/s was performed as documented below  . Vag. Bleeding: None.  Movement: Absent. denies leaking of fluid.  Review of Systems:   Pertinent items are noted in HPI Denies abnormal vaginal discharge w/ itching/odor/irritation, headaches, visual changes, shortness of breath, chest pain, abdominal pain, severe nausea/vomiting, or problems with urination or bowel movements unless otherwise stated above. Pertinent History Reviewed:  Reviewed past medical,surgical, social, obstetrical and family history.  Reviewed problem list, medications and allergies. Physical Assessment:   Vitals:   08/09/18 0942  BP: 124/79  Pulse: 92  Weight: 228 lb (103.4 kg)  Body mass index is 35.71 kg/m.           Physical Examination:   General appearance: alert, well appearing, and in no distress  Mental status: alert, oriented to person, place, and time  Skin: warm & dry   Extremities: Edema: None    Cardiovascular: normal heart rate noted  Respiratory: normal respiratory effort, no distress  Abdomen: gravid, soft, non-tender  Pelvic: Cervical exam deferred         Fetal Status: Fetal Heart Rate (bpm): +u/s   Movement: Absent    Fetal Surveillance  Testing today: informal transabdominal u/s, +FCA w/ active fetus   No results found for this or any previous visit (from the past 24 hour(s)).  Assessment & Plan:  1) High-risk pregnancy [id number] at [redacted]w[redacted]d with an Estimated Date of Delivery: 03/07/19   2) AMA  3) Resolved post-coital spotting, Rh+, +FCA w/ active fetus on today's informal u/s, no further HCGs needed  Meds: No orders of the defined types were placed in this encounter.   Labs/procedures today: u/s  Treatment Plan:  As per protocol  Reviewed: Preterm labor symptoms and general obstetric precautions including but not limited to vaginal bleeding, contractions, leaking of fluid and fetal movement were reviewed in detail with the patient.  All questions were answered.  Follow-up: Return for 3/2 as scheduled for nt/it and HROB.  No orders of the defined types were placed in this encounter.  Cheral Marker CNM, [facility] 08/09/2018 12:39 PM

## 2018-08-29 ENCOUNTER — Ambulatory Visit (INDEPENDENT_AMBULATORY_CARE_PROVIDER_SITE_OTHER): Payer: Medicaid Other | Admitting: Women's Health

## 2018-08-29 ENCOUNTER — Other Ambulatory Visit: Payer: Self-pay | Admitting: Women's Health

## 2018-08-29 ENCOUNTER — Ambulatory Visit (INDEPENDENT_AMBULATORY_CARE_PROVIDER_SITE_OTHER): Payer: Medicaid Other

## 2018-08-29 ENCOUNTER — Encounter: Payer: Self-pay | Admitting: Women's Health

## 2018-08-29 VITALS — BP 136/86 | HR 79 | Wt 225.4 lb

## 2018-08-29 DIAGNOSIS — Z1389 Encounter for screening for other disorder: Secondary | ICD-10-CM | POA: Diagnosis not present

## 2018-08-29 DIAGNOSIS — O09521 Supervision of elderly multigravida, first trimester: Secondary | ICD-10-CM

## 2018-08-29 DIAGNOSIS — Z331 Pregnant state, incidental: Secondary | ICD-10-CM | POA: Diagnosis not present

## 2018-08-29 DIAGNOSIS — O351XX0 Maternal care for (suspected) chromosomal abnormality in fetus, not applicable or unspecified: Secondary | ICD-10-CM | POA: Diagnosis not present

## 2018-08-29 DIAGNOSIS — O0991 Supervision of high risk pregnancy, unspecified, first trimester: Secondary | ICD-10-CM | POA: Diagnosis not present

## 2018-08-29 DIAGNOSIS — IMO0002 Reserved for concepts with insufficient information to code with codable children: Secondary | ICD-10-CM

## 2018-08-29 DIAGNOSIS — Z3A12 12 weeks gestation of pregnancy: Secondary | ICD-10-CM | POA: Diagnosis not present

## 2018-08-29 DIAGNOSIS — Z3682 Encounter for antenatal screening for nuchal translucency: Secondary | ICD-10-CM

## 2018-08-29 DIAGNOSIS — O0990 Supervision of high risk pregnancy, unspecified, unspecified trimester: Secondary | ICD-10-CM

## 2018-08-29 LAB — POCT URINALYSIS DIPSTICK OB
Blood, UA: NEGATIVE
Glucose, UA: NEGATIVE
KETONES UA: NEGATIVE
Leukocytes, UA: NEGATIVE
Nitrite, UA: NEGATIVE
POC,PROTEIN,UA: NEGATIVE

## 2018-08-29 NOTE — Progress Notes (Signed)
HIGH-RISK PREGNANCY VISIT Patient name: Julia Padilla MRN [id number]  Date of birth: 01-Nov-1975 Chief Complaint:   Routine Prenatal Visit ([place])  History of Present Illness:   NICK FRONHEISER is a 43 y.o. [phone] female at [redacted]w[redacted]d with an Estimated Date of Delivery: 03/07/19 being seen today for ongoing management of a high-risk pregnancy complicated by AMA, prev c/s x 2, thickening of fetal nuchal fold and absent nasal bone noted today.  Today she reports no complaints.  . Vag. Bleeding: None.  Movement: Absent. denies leaking of fluid.  Review of Systems:   Pertinent items are noted in HPI Denies abnormal vaginal discharge w/ itching/odor/irritation, headaches, visual changes, shortness of breath, chest pain, abdominal pain, severe nausea/vomiting, or problems with urination or bowel movements unless otherwise stated above. Pertinent History Reviewed:  Reviewed past medical,surgical, social, obstetrical and family history.  Reviewed problem list, medications and allergies. Physical Assessment:   Vitals:   08/29/18 0932  BP: 136/86  Pulse: 79  Weight: 225 lb 6.4 oz (102.2 kg)  Body mass index is 35.3 kg/m.           Physical Examination:   General appearance: alert, well appearing, and in no distress  Mental status: alert, oriented to person, place, and time  Skin: warm & dry   Extremities: Edema: None    Cardiovascular: normal heart rate noted  Respiratory: normal respiratory effort, no distress  Abdomen: gravid, soft, non-tender  Pelvic: Cervical exam deferred         Fetal Status: Fetal Heart Rate (bpm): 160 u/s   Movement: Absent    Fetal Surveillance Testing today:  [place] 12.5wks, measurements c/w dates, fhr 160bpm, CRL 70.40mm, posterior placenta gr 0, absent NB, NT 3.62mm, normal left ovary, simple cl cyst 2.4x2.3x2cm  Results for orders placed or performed in visit on 08/29/18 (from the past 24 hour(s))  POC Urinalysis Dipstick OB   Collection Time: 08/29/18  9:38  AM  Result Value Ref Range   Color, UA     Clarity, UA     Glucose, UA Negative Negative   Bilirubin, UA     Ketones, UA neg    Spec Grav, UA     Blood, UA neg    pH, UA     POC,PROTEIN,UA Negative Negative, Trace, Small (1+), Moderate (2+), Large (3+), 4+   Urobilinogen, UA     Nitrite, UA neg    Leukocytes, UA Negative Negative   Appearance     Odor      Assessment & Plan:  1) High-risk pregnancy [id number] at [redacted]w[redacted]d with an Estimated Date of Delivery: 03/07/19   2) AMA, stable, hasn't begun baby ASA, go ahead and start today  3) Prev c/s x 2, for RCS  4) Thickened fetal nuchal fold and absent nasal bone> on today's nt u/s, discussed markers for T21. Getting MaterniT21 today already d/t AMA. Understands not diagnostic. Offered referral to MFM for genetic counseling/diagnostic testing, pt declines today.   5) ASB 1st trimester> send urine cx poc today  Meds: No orders of the defined types were placed in this encounter.  Labs/procedures today: 1st IT/NT, MaterniT21  Treatment Plan:  U/S @ 20, 24, 28, 32, 36wks       2x/wk testing nst/sono @ 36wks        Deliver @ 39-40wks  Reviewed: Preterm labor symptoms and general obstetric precautions including but not limited to vaginal bleeding, contractions, leaking of fluid and fetal movement were reviewed in detail with  the patient.  All questions were answered.  Follow-up: Return in about 3 weeks (around 09/19/2018) for HROB w/ LHE.  Orders Placed This Encounter  Procedures  . Urine Culture  . Integrated 1  . MaterniT 21 plus Core, Blood  . AMB MFM GENETICS REFERRAL  . POC Urinalysis Dipstick OB   Cheral Marker CNM, [facility] 08/29/2018 1:15 PM

## 2018-08-29 NOTE — Progress Notes (Addendum)
[place] 12+5 wks,measurements c/w dates,fhr 160 bpm,CRL 70.25 mm,posterior placenta gr 0,absent NB,NT 3.6 mm,normal left ovary,simple right corpus luteal cyst 2.4 x 2.3 x 2 cm

## 2018-08-29 NOTE — Patient Instructions (Addendum)
Begin taking a 81mg  baby aspirin daily to decrease risk of preeclampsia during pregnancy     Julia Padilla, I greatly value your feedback.  If you receive a survey following your visit with [place] today, we appreciate you taking the time to fill it out.  Thanks, Joellyn Haff, CNM, WHNP-BC   Second Trimester of Pregnancy The second trimester is from week 14 through week 27 (months 4 through 6). The second trimester is often a time when you feel your best. Your body has adjusted to being pregnant, and you begin to feel better physically. Usually, morning sickness has lessened or quit completely, you may have more energy, and you may have an increase in appetite. The second trimester is also a time when the fetus is growing rapidly. At the end of the sixth month, the fetus is about 9 inches long and weighs about 1 pounds. You will likely begin to feel the baby move (quickening) between 16 and 20 weeks of pregnancy. Body changes during your second trimester Your body continues to go through many changes during your second trimester. The changes vary from woman to woman.  Your weight will continue to increase. You will notice your lower abdomen bulging out.  You may begin to get stretch marks on your hips, abdomen, and breasts.  You may develop headaches that can be relieved by medicines. The medicines should be approved by your health care provider.  You may urinate more often because the fetus is pressing on your bladder.  You may develop or continue to have heartburn as a result of your pregnancy.  You may develop constipation because certain hormones are causing the muscles that push waste through your intestines to slow down.  You may develop hemorrhoids or swollen, bulging veins (varicose veins).  You may have back pain. This is caused by: ? Weight gain. ? Pregnancy hormones that are relaxing the joints in your pelvis. ? A shift in weight and the muscles that support your  balance.  Your breasts will continue to grow and they will continue to become tender.  Your gums may bleed and may be sensitive to brushing and flossing.  Dark spots or blotches (chloasma, mask of pregnancy) may develop on your face. This will likely fade after the baby is born.  A dark line from your belly button to the pubic area (linea nigra) may appear. This will likely fade after the baby is born.  You may have changes in your hair. These can include thickening of your hair, rapid growth, and changes in texture. Some women also have hair loss during or after pregnancy, or hair that feels dry or thin. Your hair will most likely return to normal after your baby is born.  What to expect at prenatal visits During a routine prenatal visit:  You will be weighed to make sure you and the fetus are growing normally.  Your blood pressure will be taken.  Your abdomen will be measured to track your baby's growth.  The fetal heartbeat will be listened to.  Any test results from the previous visit will be discussed.  Your health care provider may ask you:  How you are feeling.  If you are feeling the baby move.  If you have had any abnormal symptoms, such as leaking fluid, bleeding, severe headaches, or abdominal cramping.  If you are using any tobacco products, including cigarettes, chewing tobacco, and electronic cigarettes.  If you have any questions.  Other tests that may be performed  during your second trimester include:  Blood tests that check for: ? Low iron levels (anemia). ? High blood sugar that affects pregnant women (gestational diabetes) between 62 and 28 weeks. ? Rh antibodies. This is to check for a protein on red blood cells (Rh factor).  Urine tests to check for infections, diabetes, or protein in the urine.  An ultrasound to confirm the proper growth and development of the baby.  An amniocentesis to check for possible genetic problems.  Fetal screens for  spina bifida and Down syndrome.  HIV (human immunodeficiency virus) testing. Routine prenatal testing includes screening for HIV, unless you choose not to have this test.  Follow these instructions at home: Medicines  Follow your health care provider's instructions regarding medicine use. Specific medicines may be either safe or unsafe to take during pregnancy.  Take a prenatal vitamin that contains at least 600 micrograms (mcg) of folic acid.  If you develop constipation, try taking a stool softener if your health care provider approves. Eating and drinking  Eat a balanced diet that includes fresh fruits and vegetables, whole grains, good sources of protein such as meat, eggs, or tofu, and low-fat dairy. Your health care provider will help you determine the amount of weight gain that is right for you.  Avoid raw meat and uncooked cheese. These carry germs that can cause birth defects in the baby.  If you have low calcium intake from food, talk to your health care provider about whether you should take a daily calcium supplement.  Limit foods that are high in fat and processed sugars, such as fried and sweet foods.  To prevent constipation: ? Drink enough fluid to keep your urine clear or pale yellow. ? Eat foods that are high in fiber, such as fresh fruits and vegetables, whole grains, and beans. Activity  Exercise only as directed by your health care provider. Most women can continue their usual exercise routine during pregnancy. Try to exercise for 30 minutes at least 5 days a week. Stop exercising if you experience uterine contractions.  Avoid heavy lifting, wear low heel shoes, and practice good posture.  A sexual relationship may be continued unless your health care provider directs you otherwise. Relieving pain and discomfort  Wear a good support bra to prevent discomfort from breast tenderness.  Take warm sitz baths to soothe any pain or discomfort caused by hemorrhoids.  Use hemorrhoid cream if your health care provider approves.  Rest with your legs elevated if you have leg cramps or low back pain.  If you develop varicose veins, wear support hose. Elevate your feet for 15 minutes, 3-4 times a day. Limit salt in your diet. Prenatal Care  Write down your questions. Take them to your prenatal visits.  Keep all your prenatal visits as told by your health care provider. This is important. Safety  Wear your seat belt at all times when driving.  Make a list of emergency phone numbers, including numbers for family, friends, the hospital, and police and fire departments. General instructions  Ask your health care provider for a referral to a local prenatal education class. Begin classes no later than the beginning of month 6 of your pregnancy.  Ask for help if you have counseling or nutritional needs during pregnancy. Your health care provider can offer advice or refer you to specialists for help with various needs.  Do not use hot tubs, steam rooms, or saunas.  Do not douche or use tampons or scented sanitary pads.  Do not cross your legs for long periods of time.  Avoid cat litter boxes and soil used by cats. These carry germs that can cause birth defects in the baby and possibly loss of the fetus by miscarriage or stillbirth.  Avoid all smoking, herbs, alcohol, and unprescribed drugs. Chemicals in these products can affect the formation and growth of the baby.  Do not use any products that contain nicotine or tobacco, such as cigarettes and e-cigarettes. If you need help quitting, ask your health care provider.  Visit your dentist if you have not gone yet during your pregnancy. Use a soft toothbrush to brush your teeth and be gentle when you floss. Contact a health care provider if:  You have dizziness.  You have mild pelvic cramps, pelvic pressure, or nagging pain in the abdominal area.  You have persistent nausea, vomiting, or diarrhea.  You  have a bad smelling vaginal discharge.  You have pain when you urinate. Get help right away if:  You have a fever.  You are leaking fluid from your vagina.  You have spotting or bleeding from your vagina.  You have severe abdominal cramping or pain.  You have rapid weight gain or weight loss.  You have shortness of breath with chest pain.  You notice sudden or extreme swelling of your face, hands, ankles, feet, or legs.  You have not felt your baby move in over an hour.  You have severe headaches that do not go away when you take medicine.  You have vision changes. Summary  The second trimester is from week 14 through week 27 (months 4 through 6). It is also a time when the fetus is growing rapidly.  Your body goes through many changes during pregnancy. The changes vary from woman to woman.  Avoid all smoking, herbs, alcohol, and unprescribed drugs. These chemicals affect the formation and growth your baby.  Do not use any tobacco products, such as cigarettes, chewing tobacco, and e-cigarettes. If you need help quitting, ask your health care provider.  Contact your health care provider if you have any questions. Keep all prenatal visits as told by your health care provider. This is important. This information is not intended to replace advice given to you by your health care provider. Make sure you discuss any questions you have with your health care provider. Document Released: 06/09/2001 Document Revised: 11/21/2015 Document Reviewed: 08/16/2012 Elsevier Interactive Patient Education  2017 [employer]

## 2018-08-31 ENCOUNTER — Other Ambulatory Visit: Payer: Self-pay | Admitting: Obstetrics and Gynecology

## 2018-08-31 ENCOUNTER — Telehealth: Payer: Self-pay | Admitting: Obstetrics and Gynecology

## 2018-08-31 DIAGNOSIS — O351XX0 Maternal care for (suspected) chromosomal abnormality in fetus, not applicable or unspecified: Secondary | ICD-10-CM

## 2018-08-31 DIAGNOSIS — IMO0002 Reserved for concepts with insufficient information to code with codable children: Secondary | ICD-10-CM

## 2018-08-31 NOTE — Telephone Encounter (Signed)
Call cancelled

## 2018-09-01 ENCOUNTER — Other Ambulatory Visit: Payer: Self-pay | Admitting: Women's Health

## 2018-09-01 LAB — URINE CULTURE

## 2018-09-02 ENCOUNTER — Other Ambulatory Visit: Payer: Self-pay | Admitting: Women's Health

## 2018-09-02 ENCOUNTER — Ambulatory Visit (HOSPITAL_COMMUNITY): Payer: Medicaid Other

## 2018-09-02 MED ORDER — NITROFURANTOIN MONOHYD MACRO 100 MG PO CAPS: 100.0000 mg | ORAL_CAPSULE | Freq: Two times a day (BID) | ORAL | 0 refills | Status: DC

## 2018-09-04 LAB — MATERNIT 21 PLUS CORE, BLOOD
Chromosome 13: NEGATIVE
Chromosome 18: NEGATIVE
Chromosome 21: POSITIVE — AB
Y Chromosome: NOT DETECTED

## 2018-09-05 ENCOUNTER — Telehealth: Payer: Self-pay | Admitting: Women's Health

## 2018-09-05 DIAGNOSIS — O285 Abnormal chromosomal and genetic finding on antenatal screening of mother: Secondary | ICD-10-CM | POA: Insufficient documentation

## 2018-09-05 NOTE — Telephone Encounter (Signed)
Called pt, reviewed results of MaterniT21, +T21. Understands not diagnostic, but combined w/ thickened NT and absent NB on NT u/s, likely. Offered referral to MFM for counseling/discuss diagnostic testing, is unsure at this time, she will call us back and let us know if she decides she wants referral.  Cheral Marker, CNM, WHNP-BC 09/05/2018 10:29 AM

## 2018-09-06 LAB — INTEGRATED 1
Additional US: ABSENT
Crown Rump Length: 70.3 mm
Gest. Age on Collection Date: 13 weeks
Maternal Age at EDD: 41.1 yr
Nuchal Translucency (NT): 3.6 mm
Number of Fetuses: 1
PAPP-A VALUE: 375.1 ng/mL
Weight: 225 [lb_av]

## 2018-09-19 ENCOUNTER — Ambulatory Visit (INDEPENDENT_AMBULATORY_CARE_PROVIDER_SITE_OTHER): Payer: Medicaid Other | Admitting: Obstetrics & Gynecology

## 2018-09-19 ENCOUNTER — Other Ambulatory Visit: Payer: Self-pay

## 2018-09-19 VITALS — BP 141/87 | HR 82 | Wt 228.0 lb

## 2018-09-19 DIAGNOSIS — Z87440 Personal history of urinary (tract) infections: Secondary | ICD-10-CM | POA: Diagnosis not present

## 2018-09-19 DIAGNOSIS — Z3A15 15 weeks gestation of pregnancy: Secondary | ICD-10-CM

## 2018-09-19 DIAGNOSIS — O0992 Supervision of high risk pregnancy, unspecified, second trimester: Secondary | ICD-10-CM | POA: Diagnosis not present

## 2018-09-19 DIAGNOSIS — Z1389 Encounter for screening for other disorder: Secondary | ICD-10-CM | POA: Diagnosis not present

## 2018-09-19 DIAGNOSIS — Z331 Pregnant state, incidental: Secondary | ICD-10-CM

## 2018-09-19 DIAGNOSIS — Z1379 Encounter for other screening for genetic and chromosomal anomalies: Secondary | ICD-10-CM | POA: Diagnosis not present

## 2018-09-19 DIAGNOSIS — O285 Abnormal chromosomal and genetic finding on antenatal screening of mother: Secondary | ICD-10-CM

## 2018-09-19 LAB — POCT URINALYSIS DIPSTICK OB
Blood, UA: NEGATIVE
Glucose, UA: NEGATIVE
KETONES UA: NEGATIVE
Leukocytes, UA: NEGATIVE
Nitrite, UA: NEGATIVE
POC,PROTEIN,UA: NEGATIVE

## 2018-09-19 NOTE — Progress Notes (Signed)
   HIGH-RISK PREGNANCY VISIT Patient name: Julia Padilla MRN [id number]  Date of birth: 1976/03/19 Chief Complaint:   High Risk Gestation (2nd IT)  History of Present Illness:   Julia Padilla is a 43 y.o. [phone] female at [redacted]w[redacted]d with an Estimated Date of Delivery: 03/07/19 being seen today for ongoing management of a high-risk pregnancy complicated by Trisomy 21 by cfDNA testing.  Today she reports no complaints.  . Vag. Bleeding: None.   . denies leaking of fluid.  Review of Systems:   Pertinent items are noted in HPI Denies abnormal vaginal discharge w/ itching/odor/irritation, headaches, visual changes, shortness of breath, chest pain, abdominal pain, severe nausea/vomiting, or problems with urination or bowel movements unless otherwise stated above. Pertinent History Reviewed:  Reviewed past medical,surgical, social, obstetrical and family history.  Reviewed problem list, medications and allergies. Physical Assessment:   Vitals:   09/19/18 1223  BP: (!) 141/87  Pulse: 82  Weight: 228 lb (103.4 kg)  Body mass index is 35.71 kg/m.           Physical Examination:   General appearance: alert, well appearing, and in no distress  Mental status: alert, oriented to person, place, and time  Skin: warm & dry   Extremities: Edema: Trace    Cardiovascular: normal heart rate noted  Respiratory: normal respiratory effort, no distress  Abdomen: gravid, soft, non-tender  Pelvic: Cervical exam deferred         Fetal Status:          Fetal Surveillance Testing today: FHR 162   Results for orders placed or performed in visit on 09/19/18 (from the past 24 hour(s))  POC Urinalysis Dipstick OB   Collection Time: 09/19/18 12:23 PM  Result Value Ref Range   Color, UA     Clarity, UA     Glucose, UA Negative Negative   Bilirubin, UA     Ketones, UA neg    Spec Grav, UA     Blood, UA neg    pH, UA     POC,PROTEIN,UA Negative Negative, Trace, Small (1+), Moderate (2+), Large (3+), 4+    Urobilinogen, UA     Nitrite, UA neg    Leukocytes, UA Negative Negative   Appearance     Odor      Assessment & Plan:  1) High-risk pregnancy [id number] at [redacted]w[redacted]d with an Estimated Date of Delivery: 03/07/19   2) Trisomy 21, does not wish to consider termination    Meds: No orders of the defined types were placed in this encounter.   Labs/procedures today:   Treatment Plan:  Anatomy scan 3 weeks fetal echo 24 weeks, declines MFM consult  Reviewed:  labor symptoms and general obstetric precautions including but not limited to vaginal bleeding, contractions, leaking of fluid and fetal movement were reviewed in detail with the patient.  All questions were answered.  Follow-up: Return in about 3 weeks (around 10/10/2018) for 20 week sono, HROB.  Orders Placed This Encounter  Procedures  . Urine Culture  . INTEGRATED 2  . POC Urinalysis Dipstick OB   Lazaro Arms 09/19/2018 12:38 PM

## 2018-09-21 ENCOUNTER — Other Ambulatory Visit: Payer: Self-pay | Admitting: Obstetrics & Gynecology

## 2018-09-21 LAB — INTEGRATED 2
AFP MoM: 1.23
Additional US: ABSENT
Alpha-Fetoprotein: 27.9 ng/mL
Crown Rump Length: 70.3 mm
DIA MOM: 2.82
DIA Value: 374.7 pg/mL
Estriol, Unconjugated: 0.82 ng/mL
GESTATIONAL AGE: 16 wk
Gest. Age on Collection Date: 13 weeks
Maternal Age at EDD: 41.1 yr
Nuchal Translucency (NT): 3.6 mm
Nuchal Translucency MoM: 2.25
Number of Fetuses: 1
PAPP-A MOM: 0.54
PAPP-A VALUE: 375.1 ng/mL
Test Results:: POSITIVE — AB
Weight: 225 [lb_av]
Weight: 225 [lb_av]
hCG MoM: 3.27
hCG Value: 90.4 IU/mL
uE3 MoM: 1.06

## 2018-09-21 LAB — URINE CULTURE

## 2018-09-21 MED ORDER — CEPHALEXIN 500 MG PO CAPS: 500.0000 mg | ORAL_CAPSULE | Freq: Three times a day (TID) | ORAL | 0 refills | Status: DC

## 2018-09-28 ENCOUNTER — Telehealth: Payer: Self-pay | Admitting: Obstetrics and Gynecology

## 2018-09-28 NOTE — Telephone Encounter (Signed)
Patient calling and states that she believes she is having a allergic reaction to the keflex. She is itching all over. No lips or tongue swelling. Advised pt to stop medication and that nurse will be in touch.

## 2018-09-29 ENCOUNTER — Other Ambulatory Visit: Payer: Self-pay | Admitting: Women's Health

## 2018-09-29 MED ORDER — NITROFURANTOIN MONOHYD MACRO 100 MG PO CAPS: 100.0000 mg | ORAL_CAPSULE | Freq: Two times a day (BID) | ORAL | 0 refills | Status: DC

## 2018-10-06 ENCOUNTER — Other Ambulatory Visit: Payer: Self-pay | Admitting: Obstetrics & Gynecology

## 2018-10-06 ENCOUNTER — Telehealth: Payer: Self-pay | Admitting: *Deleted

## 2018-10-06 DIAGNOSIS — Z363 Encounter for antenatal screening for malformations: Secondary | ICD-10-CM

## 2018-10-06 DIAGNOSIS — O285 Abnormal chromosomal and genetic finding on antenatal screening of mother: Secondary | ICD-10-CM

## 2018-10-06 NOTE — Telephone Encounter (Signed)
Called patient and heard message that vm not set up.

## 2018-10-10 ENCOUNTER — Other Ambulatory Visit: Payer: Self-pay

## 2018-10-10 ENCOUNTER — Ambulatory Visit (INDEPENDENT_AMBULATORY_CARE_PROVIDER_SITE_OTHER): Payer: Medicaid Other

## 2018-10-10 ENCOUNTER — Ambulatory Visit (INDEPENDENT_AMBULATORY_CARE_PROVIDER_SITE_OTHER): Payer: Medicaid Other | Admitting: Obstetrics & Gynecology

## 2018-10-10 VITALS — BP 136/90 | HR 100 | Wt 229.0 lb

## 2018-10-10 DIAGNOSIS — O3432 Maternal care for cervical incompetence, second trimester: Secondary | ICD-10-CM | POA: Diagnosis not present

## 2018-10-10 DIAGNOSIS — Z363 Encounter for antenatal screening for malformations: Secondary | ICD-10-CM

## 2018-10-10 DIAGNOSIS — O285 Abnormal chromosomal and genetic finding on antenatal screening of mother: Secondary | ICD-10-CM | POA: Diagnosis not present

## 2018-10-10 DIAGNOSIS — N883 Incompetence of cervix uteri: Secondary | ICD-10-CM

## 2018-10-10 DIAGNOSIS — O0990 Supervision of high risk pregnancy, unspecified, unspecified trimester: Secondary | ICD-10-CM

## 2018-10-10 DIAGNOSIS — Q909 Down syndrome, unspecified: Secondary | ICD-10-CM

## 2018-10-10 DIAGNOSIS — Z331 Pregnant state, incidental: Secondary | ICD-10-CM

## 2018-10-10 DIAGNOSIS — Z1389 Encounter for screening for other disorder: Secondary | ICD-10-CM

## 2018-10-10 DIAGNOSIS — O0992 Supervision of high risk pregnancy, unspecified, second trimester: Secondary | ICD-10-CM

## 2018-10-10 DIAGNOSIS — Z3A18 18 weeks gestation of pregnancy: Secondary | ICD-10-CM

## 2018-10-10 LAB — POCT URINALYSIS DIPSTICK OB
Blood, UA: NEGATIVE
Glucose, UA: NEGATIVE
Ketones, UA: NEGATIVE
Leukocytes, UA: NEGATIVE
Nitrite, UA: NEGATIVE

## 2018-10-10 NOTE — Progress Notes (Signed)
[place] 18+6 wks,breech,posterior placenta gr 0,incompetent cx with bulging membranes (hourglass appearance),no measurable cervix,amniotic sludge,svp of fluid 3.4 cm,normal ovaries bilat,thickened nuchal fold 7.1 mm,absent NB,pericardial effusion,fhr 138 bpm,limited views of the heart,EFW 235 G 19%,Dr Eure reviewed images during ultrasound

## 2018-10-10 NOTE — Progress Notes (Signed)
   HIGH-RISK PREGNANCY VISIT Patient name: Julia Padilla MRN [id number]  Date of birth: 08/29/1975 Chief Complaint:   Routine Prenatal Visit ([place])  History of Present Illness:   Julia Padilla is a 43 y.o. [phone] female at [redacted]w[redacted]d with an Estimated Date of Delivery: 03/07/19 being seen today for ongoing management of a high-risk pregnancy complicated by Trisomy 21 of the fetus.  Today she reports no complaints. Contractions: Not present. Vag. Bleeding: None.  Movement: Present. denies leaking of fluid.  Review of Systems:   Pertinent items are noted in HPI Denies abnormal vaginal discharge w/ itching/odor/irritation, headaches, visual changes, shortness of breath, chest pain, abdominal pain, severe nausea/vomiting, or problems with urination or bowel movements unless otherwise stated above. Pertinent History Reviewed:  Reviewed past medical,surgical, social, obstetrical and family history.  Reviewed problem list, medications and allergies. Physical Assessment:   Vitals:   10/10/18 1206  BP: 136/90  Pulse: 100  Weight: 229 lb (103.9 kg)  Body mass index is 35.87 kg/m.           Physical Examination:   General appearance: alert, well appearing, and in no distress  Mental status: alert, oriented to person, place, and time  Skin: warm & dry   Extremities: Edema: Trace    Cardiovascular: normal heart rate noted  Respiratory: normal respiratory effort, no distress  Abdomen: gravid, soft, non-tender  Pelvic: Cervical exam deferred         Fetal Status:     Movement: Present    Fetal Surveillance Testing today: sonogram   Results for orders placed or performed in visit on 10/10/18 (from the past 24 hour(s))  POC Urinalysis Dipstick OB   Collection Time: 10/10/18 12:08 PM  Result Value Ref Range   Color, UA     Clarity, UA     Glucose, UA Negative Negative   Bilirubin, UA     Ketones, UA neg    Spec Grav, UA     Blood, UA neg    pH, UA     POC,PROTEIN,UA Trace Negative,  Trace, Small (1+), Moderate (2+), Large (3+), 4+   Urobilinogen, UA     Nitrite, UA neg    Leukocytes, UA Negative Negative   Appearance     Odor      Assessment & Plan:  1) High-risk pregnancy [id number] at [redacted]w[redacted]d with an Estimated Date of Delivery: 03/07/19   2) Trisomy 21, stable, pericardial effusion is present on sonogram and full anatomy cannot be assessed, suspicious of cardiac anomaly as well, will have fetal ECHO at 24 weeks  3) Cervical incompetence,, unstable, finding of hourglassing membranes with cervical funneling and about 2 cm opening. Is noted on sonogram, no hisotry or risk factors noted  Meds: No orders of the defined types were placed in this encounter.   Labs/procedures today: sonogram  Treatment Plan:  Pt is deciding if she is interested in cerclage in this setting of trisomy 21 with possible cardiac issue.  Will do televisit tomorrow for patient's decision, otherwise repeat scan in 2 weeks or so to recheck cervical status  Reviewed: Preterm labor symptoms and general obstetric precautions including but not limited to vaginal bleeding, contractions, leaking of fluid and fetal movement were reviewed in detail with the patient.  All questions were answered.  Follow-up: Return for televisit tomorrow with Dr Despina Hidden @1200 .  Orders Placed This Encounter  Procedures  . POC Urinalysis Dipstick OB   Lazaro Arms  10/11/2018 10:57 AM

## 2018-10-11 ENCOUNTER — Ambulatory Visit (INDEPENDENT_AMBULATORY_CARE_PROVIDER_SITE_OTHER): Payer: Medicaid Other | Admitting: Obstetrics & Gynecology

## 2018-10-11 DIAGNOSIS — O351XX0 Maternal care for (suspected) chromosomal abnormality in fetus, not applicable or unspecified: Secondary | ICD-10-CM

## 2018-10-11 DIAGNOSIS — Z3A19 19 weeks gestation of pregnancy: Secondary | ICD-10-CM

## 2018-10-11 DIAGNOSIS — N883 Incompetence of cervix uteri: Secondary | ICD-10-CM

## 2018-10-11 DIAGNOSIS — O0992 Supervision of high risk pregnancy, unspecified, second trimester: Secondary | ICD-10-CM

## 2018-10-11 DIAGNOSIS — Q909 Down syndrome, unspecified: Secondary | ICD-10-CM

## 2018-10-11 NOTE — Progress Notes (Signed)
   TELEHEALTH VIRTUAL OBSTETRICS VISIT ENCOUNTER NOTE  I connected with Julia Padilla on 10/11/18 at 12:00 PM EDT by telephone at home and verified that I am speaking with the correct person using two identifiers.   I discussed the limitations, risks, security and privacy concerns of performing an evaluation and management service by telephone and the availability of in person appointments. I also discussed with the patient that there may be a patient responsible charge related to this service. The patient expressed understanding and agreed to proceed.  Subjective:  Julia Padilla is a 42 y.o. [id number] at [redacted]w[redacted]d being followed for ongoing prenatal care.  She is currently monitored for the following issues for this high-risk pregnancy and has Anemia, iron deficiency; Pregnancy with history of cesarean section, antepartum; Supervision of high risk pregnancy, antepartum; AMA (advanced maternal age) multigravida 35+; Vaginal cyst; Asymptomatic bacteriuria during pregnancy in first trimester; Thickening of nuchal fold; and Abnormal genetic test during pregnancy on their problem list.  Patient reports no complaints. Reports fetal movement. Denies any contractions, bleeding or leaking of fluid.   The following portions of the patient's history were reviewed and updated as appropriate: allergies, current medications, past family history, past medical history, past social history, past surgical history and problem list.   Objective:   General:  Alert, oriented and cooperative.   Mental Status: Normal mood and affect perceived. Normal judgment and thought content.  Rest of physical exam deferred due to type of encounter  Assessment and Plan:  Supervision of high risk pregnancy in second trimester  Cervical incompetence  Trisomy 21   Pregnancy: [id number] at [redacted]w[redacted]d with Trisomy 21 and possible heart anomaly as well now with cervical incompetence by sonogram 4/13/202  Today's call was to follow up  her visit from yesterday which was centered around the presence of hour glassing membranes past the internal os with cervical funneling.  We discussed options including rescue cerclage vs conservative management.  I called today and answered all questions and re explained her options.  Dora D Steady has decided to not intervene at this point, she declines rescue cerclage or other means of altering the natural course of this pregnancy.  She understnds this may mean sudden unexpected painless delivery at home in the near future.  She understands the baby will not be viable until 24 weeks.          Preterm labor symptoms and general obstetric precautions including but not limited to vaginal bleeding, contractions, leaking of fluid and fetal movement were reviewed in detail with the patient.  I discussed the assessment and treatment plan with the patient. The patient was provided an opportunity to ask questions and all were answered. The patient agreed with the plan and demonstrated an understanding of the instructions. The patient was advised to call back or seek an in-person office evaluation/go to MAU at [facility] for any urgent or concerning symptoms. Please refer to After Visit Summary for other counseling recommendations.   I provided 11 minutes of non-face-to-face time during this encounter. All questions were answered   Return in about 3 weeks (around 11/01/2018) for sonogram for cervical eval, , HROB, with Dr Despina Hidden.  Future Appointments  Date Time Provider Department Center  10/11/2018 12:00 PM Makylah Bossard, Amaryllis Dyke, MD [facility]    Lazaro Arms, MD Center for [facility], [facility]

## 2018-10-12 ENCOUNTER — Telehealth: Payer: Self-pay | Admitting: Obstetrics & Gynecology

## 2018-10-12 NOTE — Telephone Encounter (Signed)
Patient called, stated that Dr. Despina Hidden told her that she may possibly miscarry.  She stated that she is having a lot of slimey stuff coming out and pain, started about 20 - 30 mins ago.  [phone]

## 2018-10-12 NOTE — Telephone Encounter (Signed)
Patient states she has a slimy vaginal discharge coming out.  She does not have any itching, odor or bleeding noted.  Informed it was probably some her of mucous plug and may continue to come out.  At this time, the plan of care would not change so unless she started having bleeding or cramping, she did not need to be seen.  Pt verbalized understanding.

## 2018-10-13 ENCOUNTER — Other Ambulatory Visit: Payer: Self-pay

## 2018-10-13 ENCOUNTER — Encounter (HOSPITAL_COMMUNITY): Payer: Self-pay | Admitting: *Deleted

## 2018-10-13 ENCOUNTER — Inpatient Hospital Stay (HOSPITAL_COMMUNITY)
Admission: AD | Admit: 2018-10-13 | Discharge: 2018-10-13 | Disposition: A | Discharge status: Home / Self Care | Payer: Medicaid Other | Attending: Obstetrics & Gynecology | Admitting: Obstetrics & Gynecology

## 2018-10-13 DIAGNOSIS — M545 Low back pain: Secondary | ICD-10-CM | POA: Insufficient documentation

## 2018-10-13 DIAGNOSIS — O42919 Preterm premature rupture of membranes, unspecified as to length of time between rupture and onset of labor, unspecified trimester: Secondary | ICD-10-CM | POA: Diagnosis not present

## 2018-10-13 DIAGNOSIS — O26892 Other specified pregnancy related conditions, second trimester: Secondary | ICD-10-CM | POA: Diagnosis not present

## 2018-10-13 DIAGNOSIS — Z7982 Long term (current) use of aspirin: Secondary | ICD-10-CM | POA: Insufficient documentation

## 2018-10-13 DIAGNOSIS — O99012 Anemia complicating pregnancy, second trimester: Secondary | ICD-10-CM | POA: Diagnosis not present

## 2018-10-13 DIAGNOSIS — O034 Incomplete spontaneous abortion without complication: Secondary | ICD-10-CM

## 2018-10-13 DIAGNOSIS — D509 Iron deficiency anemia, unspecified: Secondary | ICD-10-CM | POA: Diagnosis not present

## 2018-10-13 DIAGNOSIS — Z3A19 19 weeks gestation of pregnancy: Secondary | ICD-10-CM

## 2018-10-13 DIAGNOSIS — O039 Complete or unspecified spontaneous abortion without complication: Secondary | ICD-10-CM

## 2018-10-13 LAB — POCT FERN TEST: POCT Fern Test: POSITIVE

## 2018-10-13 MED ORDER — AZITHROMYCIN 250 MG PO TABS
1000.0000 mg | ORAL_TABLET | Freq: Once | ORAL | Status: AC
  Administered 2018-10-13: 1000 mg via ORAL
  Filled 2018-10-13: qty 4

## 2018-10-13 MED ORDER — ONDANSETRON 4 MG PO TBDP
4.0000 mg | ORAL_TABLET | Freq: Once | ORAL | Status: AC
  Administered 2018-10-13: 22:00:00 4 mg via ORAL
  Filled 2018-10-13: qty 1

## 2018-10-13 MED ORDER — AMOXICILLIN 500 MG PO CAPS: 500.0000 mg | ORAL_CAPSULE | Freq: Three times a day (TID) | ORAL | 0 refills | Status: DC

## 2018-10-13 MED ORDER — IBUPROFEN 400 MG PO TABS: 400.0000 mg | ORAL_TABLET | Freq: Four times a day (QID) | ORAL | 0 refills | Status: DC | PRN

## 2018-10-13 MED ORDER — OXYCODONE-ACETAMINOPHEN 5-325 MG PO TABS: 1.0000 | ORAL_TABLET | Freq: Four times a day (QID) | ORAL | 0 refills | Status: DC | PRN

## 2018-10-13 MED ORDER — IBUPROFEN 800 MG PO TABS
400.0000 mg | ORAL_TABLET | Freq: Once | ORAL | Status: AC
  Administered 2018-10-13: 400 mg via ORAL
  Filled 2018-10-13: qty 1

## 2018-10-13 MED ORDER — OXYCODONE-ACETAMINOPHEN 5-325 MG PO TABS
2.0000 | ORAL_TABLET | Freq: Once | ORAL | Status: AC
  Administered 2018-10-13: 22:00:00 2 via ORAL
  Filled 2018-10-13: qty 2

## 2018-10-13 NOTE — MAU Note (Signed)
Was seen on Monday and told she had hourglassing membranes and would likely miscarry.  Tuesday she started having severe lower back pain that is constant and it hasn't let up.  Took tylenol last at 1500 without relief.  Sees some light pink when she wipes but no active bleeding.

## 2018-10-13 NOTE — Discharge Instructions (Signed)
Miscarriage A miscarriage is the loss of an unborn baby (fetus) before the 20th week of pregnancy. Follow these instructions at home: Medicines   Take over-the-counter and prescription medicines only as told by your doctor.  If you were prescribed antibiotic medicine, take it as told by your doctor. Do not stop taking the antibiotic even if you start to feel better.  Do not take NSAIDs unless your doctor says that this is safe for you. NSAIDs include aspirin and ibuprofen. These medicines can cause bleeding. Activity  Rest as directed. Ask your doctor what activities are safe for you.  Have someone help you at home during this time. General instructions  Write down how many pads you use each day and how soaked they are.  Watch the amount of tissue or clumps of blood (blood clots) that you pass from your vagina. Save any large amounts of tissue for your doctor.  Do not use tampons, douche, or have sex until your doctor approves.  To help you and your partner with the process of grieving, talk with your doctor or seek counseling.  When you are ready, meet with your doctor to talk about steps you should take for your health. Also, talk with your doctor about steps to take to have a healthy pregnancy in the future.  Keep all follow-up visits as told by your doctor. This is important. Contact a doctor if:  You have a fever or chills.  You have vaginal discharge that smells bad.  You have more bleeding. Get help right away if:  You have very bad cramps or pain in your back or belly.  You pass clumps of blood that are walnut-sized or larger from your vagina.  You pass tissue that is walnut-sized or larger from your vagina.  You soak more than 1 regular pad in an hour.  You get light-headed or weak.  You faint (pass out).  You have feelings of sadness that do not go away, or you have thoughts of hurting yourself. Summary  A miscarriage is the loss of an unborn baby before  the 20th week of pregnancy.  Follow your doctor's instructions for home care. Keep all follow-up appointments.  To help you and your partner with the process of grieving, talk with your doctor or seek counseling. This information is not intended to replace advice given to you by your health care provider. Make sure you discuss any questions you have with your health care provider. Document Released: 09/07/2011 Document Revised: 07/21/2016 Document Reviewed: 07/21/2016 Elsevier Interactive Patient Education  2019 [employer].   Coping with Pregnancy Loss Pregnancy loss can happen any time during a pregnancy. Often the cause is not known. It is rarely because of anything you did. Pregnancy loss in early pregnancy (during the first trimester) is called a miscarriage. This type of pregnancy loss is the most common. Pregnancy loss that happens after 20 weeks of pregnancy is called fetal demise if the baby's heart stops beating before birth. Fetal demise is much less common. Some women experience spontaneous labor shortly after fetal demise resulting in a stillborn birth (stillbirth). Any pregnancy loss can be devastating. You will need to recover both physically and emotionally. Most women are able to get pregnant again after a pregnancy loss and deliver a healthy baby. How to manage emotional recovery  Pregnancy loss is very hard emotionally. You may feel many different emotions while you grieve. You may feel sad and angry. You may also feel guilty. It is normal  to have periods of crying. Emotional recovery can take longer than physical recovery. It is different for everyone. Taking these steps can help you cope:  Remember that it is unlikely you did anything to cause the pregnancy loss.  Share your thoughts and feelings with friends, family, and your partner. Remember that your partner is also recovering emotionally.  Make sure you have a good support system, and do not spend too much time  alone.  Meet with a pregnancy loss counselor or join a pregnancy loss support group.  Get enough sleep and eat a healthy diet. Return to regular exercise when you have recovered physically.  Do not use drugs or alcohol to manage your emotions.  Consider seeing a mental health professional to help you recover emotionally.  Ask a friend or loved one to help you decide what to do with any clothing and nursery items you received for your baby. In the case of a stillbirth, many women benefit from taking additional steps in the grieving process. You may want to:  Hold your baby after the birth.  Name your baby.  Request a birth certificate.  Create a keepsake such as handprints or footprints.  Dress your baby and have a picture taken.  Make funeral arrangements.  Ask for a baptism or blessing. Hospitals have staff members who can help you with all these arrangements. How to recognize emotional stress It is normal to have emotional stress after a pregnancy loss. But emotional stress that lasts a long time or becomes severe requires treatment. Watch out for these signs of severe emotional stress:  Sadness, anger, or guilt that is not going away and is interfering with your normal activities.  Relationship problems that have occurred or gotten worse since the pregnancy loss.  Signs of depression that last longer than 2 weeks. These may include: ? Sadness. ? Anxiety. ? Hopelessness. ? Loss of interest in activities you enjoy. ? Inability to concentrate. ? Trouble sleeping or sleeping too much. ? Loss of appetite or overeating. ? Thoughts of death or of hurting yourself. Follow these instructions at home: Medicines  Take over-the-counter and prescription medicines only as told by your health care provider. Activity  Rest at home until your energy level returns. Return to your normal activities as told by your health care provider. Ask your health care provider what activities  are safe for you. General instructions  Keep all follow-up visits as told by your health care provider. This is important.  It may be helpful to meet with others who have experienced pregnancy loss. Ask your health care provider about support groups and resources.  To help you and your partner with the process of grieving, talk with your health care provider or seek counseling.  When you are ready, meet with your health care provider to discuss steps to take for a future pregnancy. Where to find more information  [place] Department of Health and Cytogeneticist on Women's Health: http://hoffman.com/  American Pregnancy Association: www.americanpregnancy.org Contact a health care provider if:  You continue to experience grief, sadness, or lack of motivation for everyday activities, and those feelings do not improve over time.  You are struggling to recover emotionally, especially if you are using alcohol or substances to help. Get help right away if:  You have thoughts of hurting yourself or others. If you ever feel like you may hurt yourself or others, or have thoughts about taking your own life, get help right away. You can go to  your nearest emergency department or call:  Your local emergency services (911 in the [place]  A suicide crisis helpline, such as the National Suicide Prevention Lifeline at 825-089-58311-343-533-3258. This is open 24 hours a day. Summary  Any pregnancy loss can be difficult physically and emotionally.  You may experience many different emotions while you grieve. Emotional recovery can last longer than physical recovery.  It is normal to have emotional stress after a pregnancy loss. But emotional stress that lasts a long time or becomes severe requires treatment.  See your health care provider if you are struggling emotionally after a pregnancy loss. This information is not intended to replace advice given to you by your health care provider. Make sure you  discuss any questions you have with your health care provider. Document Released: 08/26/2017 Document Revised: 08/26/2017 Document Reviewed: 08/26/2017 Elsevier Interactive Patient Education  2019 [employer]

## 2018-10-13 NOTE — MAU Provider Note (Signed)
Chief Complaint:  Back Pain   First Provider Initiated Contact with Patient 10/13/18 2041      HPI: Julia Padilla is a 43 y.o. [id number] at 7319w2dwho presents to maternity admissions reporting low back pain and passage of pink mucous today.  Has known hourglassing membranes and a baby with Trisomy 9321.  Does not feel much cramping in lower abdomen. . She denies LOF, vaginal itching/burning, urinary symptoms, h/a, dizziness, n/v, diarrhea, constipation or fever/chills.     Past Medical History: Past Medical History:  Diagnosis Date  . Anemia, iron deficiency 07/09/2016  . Bacterial vaginosis   . No pertinent past medical history   . Ovarian cyst   . UTI (lower urinary tract infection)     Past obstetric history: OB History  Gravida Para Term Preterm AB Living  5 2 2  0 2 2  SAB TAB Ectopic Multiple Live Births  1 0 0 0 2    # Outcome Date GA Lbr Len/2nd Weight Sex Delivery Anes PTL Lv  5 Current           4 SAB 01/2018 3728w0d         3 Term 03/13/09 4352w0d  3544 g F CS-LTranv Spinal N LIV  2 AB 2001          1 Term 05/21/99 1752w0d  3997 g M CS-LTranv EPI N LIV     Complications: Failure to Progress in Second Stage    Past Surgical History: Past Surgical History:  Procedure Laterality Date  . CESAREAN SECTION     2000 and 2010  . DILATION AND CURETTAGE OF UTERUS      Family History: Family History  Problem Relation Age of Onset  . Thyroid disease Maternal Aunt   . Thyroid disease Maternal Grandmother   . Stroke Maternal Grandmother   . Heart disease Maternal Grandmother   . Hyperlipidemia Maternal Grandmother   . Hypertension Maternal Grandmother   . Cancer Maternal Grandfather        skin  . Anesthesia problems Neg Hx     Social History: Social History   Tobacco Use  . Smoking status: Never Smoker  . Smokeless tobacco: Never Used  Substance Use Topics  . Alcohol use: No  . Drug use: No    Allergies: No Known Allergies  Meds:  Medications Prior to  Admission  Medication Sig Dispense Refill Last Dose  . aspirin 81 MG chewable tablet Chew 81 mg by mouth daily.   10/12/2018 at Unknown time  . ferrous sulfate 325 (65 FE) MG tablet Take 1 tablet (325 mg total) by mouth 2 (two) times daily with a meal. 60 tablet 3 10/12/2018 at Unknown time  . nitrofurantoin, macrocrystal-monohydrate, (MACROBID) 100 MG capsule Take 1 capsule (100 mg total) by mouth 2 (two) times daily. X 7 days 14 capsule 0 Past Month at Unknown time  . Prenatal Vit-Fe Fumarate-FA (PNV PRENATAL PLUS MULTIVITAMIN) 27-1 MG TABS Take 1 daily 30 tablet 12 10/12/2018 at Unknown time    I have reviewed patient's Past Medical Hx, Surgical Hx, Family Hx, Social Hx, medications and allergies.   ROS:  Review of Systems  Constitutional: Negative for chills and fever.  Respiratory: Negative for shortness of breath.   Gastrointestinal: Positive for nausea. Negative for abdominal pain, constipation, diarrhea and vomiting.  Genitourinary: Positive for vaginal bleeding and vaginal discharge. Negative for pelvic pain.  Musculoskeletal: Positive for back pain.   Other systems negative  Physical Exam   Patient Vitals  for the past 24 hrs:  BP Temp Pulse Resp SpO2 Height Weight  10/13/18 1958 134/72 98.4 F (36.9 C) (!) 126 20 100 % 5' 7.5" (1.715 m) -  10/13/18 1954 - - - - - - 104.6 kg   Constitutional: Well-developed, well-nourished female in no acute distress.  Cardiovascular: normal rate and rhythm Respiratory: normal effort, clear to auscultation bilaterally GI: Abd soft, non-tender, gravid appropriate for gestational age.   No rebound or guarding. MS: Extremities nontender, no edema, normal ROM Neurologic: Alert and oriented x 4.  GU: Neg CVAT.  PELVIC EXAM: Speculum exam done.  Membranes filled the vagina.  Cervix not visible.  Small to mod slightly cloudy clear watery fluid leaking.  + ferning.    FHT:  172   Labs: Results for orders placed or performed during the hospital  encounter of 10/13/18 (from the past 24 hour(s))  Fern Test     Status: None   Collection Time: 10/13/18  9:19 PM  Result Value Ref Range   POCT Fern Test Positive = ruptured amniotic membanes     O/Positive/-- (01/27 1058)  Imaging:  No results found.  MAU Course/MDM: Reviewed findings with patient.  Discussed this indicates inevitable SAB.  Unclear as to when delivery will occur.  She previously indicated she wanted to pursue the pregnancy.  Advised on expectant management, daily temps, rest as needed.   Consult Dr Macon Large with presentation, exam findings and test results.  States we can offer a course of antibiotics or not.  I chose to give Azithromycin and Rx amoxicillin to avoid infection for mother's sake.  Dr Macon Large states if patient bleeds or delivers may present to [facility].  She may come here if she wishes.  Message sent to [facility] doctors.      Assessment: Single intrauterine pregnancy at [redacted]w[redacted]d Previously documented hourglassing membranes Now with PPROM and protruding membranes  Plan: Discharge home Rx amoxicillin x 7 days Infection precautions Labor precautions Follow up in Office for prenatal visits and recheck of status  Encouraged to return here or to other Urgent Care/ED if she develops worsening of symptoms, increase in pain, fever, or other concerning symptoms.   Pt stable at time of discharge.  Wynelle Bourgeois CNM, MSN Certified Nurse-Midwife 10/13/2018 8:41 PM

## 2018-10-16 ENCOUNTER — Encounter (HOSPITAL_COMMUNITY): Payer: Self-pay | Admitting: Emergency Medicine

## 2018-10-16 ENCOUNTER — Emergency Department (HOSPITAL_COMMUNITY)
Admission: EM | Admit: 2018-10-16 | Discharge: 2018-10-16 | Disposition: A | Discharge status: Home / Self Care | Payer: Medicaid Other | Attending: Emergency Medicine | Admitting: Emergency Medicine

## 2018-10-16 ENCOUNTER — Other Ambulatory Visit: Payer: Self-pay

## 2018-10-16 DIAGNOSIS — O200 Threatened abortion: Secondary | ICD-10-CM | POA: Diagnosis not present

## 2018-10-16 DIAGNOSIS — R1084 Generalized abdominal pain: Secondary | ICD-10-CM | POA: Diagnosis not present

## 2018-10-16 DIAGNOSIS — Z7982 Long term (current) use of aspirin: Secondary | ICD-10-CM | POA: Insufficient documentation

## 2018-10-16 DIAGNOSIS — Z3A19 19 weeks gestation of pregnancy: Secondary | ICD-10-CM | POA: Insufficient documentation

## 2018-10-16 DIAGNOSIS — O039 Complete or unspecified spontaneous abortion without complication: Secondary | ICD-10-CM | POA: Diagnosis not present

## 2018-10-16 DIAGNOSIS — R58 Hemorrhage, not elsewhere classified: Secondary | ICD-10-CM | POA: Diagnosis not present

## 2018-10-16 LAB — CBC WITH DIFFERENTIAL/PLATELET
Abs Immature Granulocytes: 0.08 10*3/uL — ABNORMAL HIGH (ref 0.00–0.07)
Abs Immature Granulocytes: 0.07 10*3/uL (ref 0.00–0.07)
Basophils Absolute: 0 10*3/uL (ref 0.0–0.1)
Basophils Absolute: 0 10*3/uL (ref 0.0–0.1)
Basophils Relative: 0 %
Basophils Relative: 0 %
Eosinophils Absolute: 0.1 10*3/uL (ref 0.0–0.5)
Eosinophils Absolute: 0 10*3/uL (ref 0.0–0.5)
Eosinophils Relative: 0 %
Eosinophils Relative: 0 %
HCT: 31.7 % — ABNORMAL LOW (ref 36.0–46.0)
HCT: 27.5 % — ABNORMAL LOW (ref 36.0–46.0)
Hemoglobin: 10.3 g/dL — ABNORMAL LOW (ref 12.0–15.0)
Hemoglobin: 8.8 g/dL — ABNORMAL LOW (ref 12.0–15.0)
Immature Granulocytes: 1 %
Immature Granulocytes: 1 %
Lymphocytes Relative: 4 %
Lymphocytes Relative: 6 %
Lymphs Abs: 0.6 10*3/uL — ABNORMAL LOW (ref 0.7–4.0)
Lymphs Abs: 0.8 10*3/uL (ref 0.7–4.0)
MCH: 27 pg (ref 26.0–34.0)
MCH: 26.9 pg (ref 26.0–34.0)
MCHC: 32.5 g/dL (ref 30.0–36.0)
MCHC: 32 g/dL (ref 30.0–36.0)
MCV: 83.2 fL (ref 80.0–100.0)
MCV: 84.1 fL (ref 80.0–100.0)
Monocytes Absolute: 1 10*3/uL (ref 0.1–1.0)
Monocytes Absolute: 0.7 10*3/uL (ref 0.1–1.0)
Monocytes Relative: 6 %
Monocytes Relative: 5 %
Neutro Abs: 13.6 10*3/uL — ABNORMAL HIGH (ref 1.7–7.7)
Neutro Abs: 13.5 10*3/uL — ABNORMAL HIGH (ref 1.7–7.7)
Neutrophils Relative %: 89 %
Neutrophils Relative %: 88 %
Platelets: 185 10*3/uL (ref 150–400)
Platelets: 179 10*3/uL (ref 150–400)
RBC: 3.81 MIL/uL — ABNORMAL LOW (ref 3.87–5.11)
RBC: 3.27 MIL/uL — ABNORMAL LOW (ref 3.87–5.11)
RDW: 17.4 % — ABNORMAL HIGH (ref 11.5–15.5)
RDW: 17.2 % — ABNORMAL HIGH (ref 11.5–15.5)
WBC: 15.3 10*3/uL — ABNORMAL HIGH (ref 4.0–10.5)
WBC: 15.2 10*3/uL — ABNORMAL HIGH (ref 4.0–10.5)
nRBC: 0 % (ref 0.0–0.2)
nRBC: 0 % (ref 0.0–0.2)

## 2018-10-16 LAB — BASIC METABOLIC PANEL
Anion gap: 11 (ref 5–15)
BUN: 9 mg/dL (ref 6–20)
CO2: 20 mmol/L — ABNORMAL LOW (ref 22–32)
Calcium: 8.5 mg/dL — ABNORMAL LOW (ref 8.9–10.3)
Chloride: 104 mmol/L (ref 98–111)
Creatinine, Ser: 0.72 mg/dL (ref 0.44–1.00)
GFR calc Af Amer: >60 mL/min (ref >60)
GFR calc non Af Amer: >60 mL/min (ref >60)
Glucose, Bld: 128 mg/dL — ABNORMAL HIGH (ref 70–99)
Potassium: 3.3 mmol/L — ABNORMAL LOW (ref 3.5–5.1)
Sodium: 135 mmol/L (ref 135–145)

## 2018-10-16 LAB — TYPE AND SCREEN
ABO/RH(D): O POS
Antibody Screen: NEGATIVE

## 2018-10-16 MED ORDER — CEFAZOLIN SODIUM-DEXTROSE 2-4 GM/100ML-% IV SOLN
2.0000 g | Freq: Once | INTRAVENOUS | Status: AC
  Administered 2018-10-16: 2 g via INTRAVENOUS
  Filled 2018-10-16: qty 100

## 2018-10-16 MED ORDER — HYDROMORPHONE HCL 1 MG/ML IJ SOLN: 1.0000 mg | INTRAMUSCULAR | Status: DC | PRN

## 2018-10-16 MED ORDER — MISOPROSTOL 100 MCG PO TABS
600.0000 ug | ORAL_TABLET | Freq: Once | ORAL | Status: AC
  Administered 2018-10-16: 600 ug via ORAL
  Filled 2018-10-16: qty 6

## 2018-10-16 MED ORDER — MISOPROSTOL 200 MCG PO TABS: ORAL_TABLET | ORAL | 0 refills | Status: DC

## 2018-10-16 MED ORDER — SODIUM CHLORIDE 0.9 % IV SOLN: INTRAVENOUS | Status: DC

## 2018-10-16 MED ORDER — HYDROMORPHONE HCL 1 MG/ML IJ SOLN
1.0000 mg | Freq: Once | INTRAMUSCULAR | Status: AC
  Administered 2018-10-16: 1 mg via INTRAVENOUS

## 2018-10-16 MED ORDER — OXYTOCIN 10 UNIT/ML IJ SOLN
20.0000 [IU] | Freq: Once | INTRAMUSCULAR | Status: AC
  Administered 2018-10-16: 20 [IU]
  Filled 2018-10-16: qty 2

## 2018-10-16 MED ORDER — MORPHINE SULFATE (PF) 4 MG/ML IV SOLN
4.0000 mg | Freq: Once | INTRAVENOUS | Status: AC
  Administered 2018-10-16: 4 mg via INTRAVENOUS
  Filled 2018-10-16: qty 1

## 2018-10-16 MED ORDER — HYDROMORPHONE HCL 1 MG/ML IJ SOLN
INTRAMUSCULAR | Status: AC
  Filled 2018-10-16: qty 1

## 2018-10-16 MED ORDER — SODIUM CHLORIDE 0.9 % IV BOLUS
1000.0000 mL | Freq: Once | INTRAVENOUS | Status: AC
  Administered 2018-10-16: 1000 mL via INTRAVENOUS

## 2018-10-16 MED ORDER — ONDANSETRON HCL 4 MG/2ML IJ SOLN
4.0000 mg | Freq: Once | INTRAMUSCULAR | Status: AC
  Administered 2018-10-16: 4 mg via INTRAVENOUS
  Filled 2018-10-16: qty 2

## 2018-10-16 MED ORDER — HYDROMORPHONE HCL 1 MG/ML IJ SOLN
1.0000 mg | Freq: Once | INTRAMUSCULAR | Status: AC
  Administered 2018-10-16: 1 mg via INTRAVENOUS
  Filled 2018-10-16: qty 1

## 2018-10-16 NOTE — ED Notes (Signed)
Fetus taken to lab in container for formalin preservation.

## 2018-10-16 NOTE — ED Provider Notes (Signed)
[facility] EMERGENCY DEPARTMENT Provider Note   CSN: [id number] Arrival date & time: 10/16/18  [id number]    History   Chief Complaint Chief Complaint  Patient presents with  . Miscarriage    HPI Naomy D Duffy RhodyStanley is a 43 y.o. female.      43 y.o. [id number] at 19w presenting by EMS with concern for miscarriage.  States she woke up around 4 AM with abdominal cramping and bleeding and delivered her fetus at home.  She had [facility] and told that her membranes were ruptured and bulging her cervix and a miscarriage was expected.  Reports that she believes she delivered the placenta as well.  She is still having some cramping and vaginal bleeding. EMS reports vitals were stable.  She denies any dizziness or lightheadedness.  No chest pain or shortness of breath.  States her blood type is O+.  The history is provided by the patient and the EMS personnel.    Past Medical History:  Diagnosis Date  . Anemia, iron deficiency 07/09/2016  . Bacterial vaginosis   . No pertinent past medical history   . Ovarian cyst   . UTI (lower urinary tract infection)     Patient Active Problem List   Diagnosis Date Noted  . Abnormal genetic test during pregnancy 09/05/2018  . 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 Asymptomatic bacteriuria during pregnancy in first trimester 08/01/2018  . Supervision of high risk pregnancy, antepartum 07/25/2018  . AMA (advanced maternal age) multigravida 35+ 07/25/2018  . Vaginal cyst 07/25/2018  . Pregnancy with history of cesarean section, antepartum 01/17/2018  . Anemia, iron deficiency 07/09/2016    Past Surgical History:  Procedure Laterality Date  . CESAREAN SECTION     2000 and 2010  . DILATION AND CURETTAGE OF UTERUS       OB History    Gravida  5   Para  2   Term  2   Preterm  0   AB  2   Living  2     SAB  1   TAB  0   Ectopic  0   Multiple  0   Live Births  2            Home Medications    Prior to Admission medications   Medication Sig Start Date End Date Taking? Authorizing Provider  amoxicillin (AMOXIL) 500 MG capsule Take 1 capsule (500 mg total) by mouth every 8 (eight) hours. 10/13/18   Aviva SignsWilliams, Marie L, CNM  aspirin 81 MG chewable tablet Chew 81 mg by mouth daily.    [provider]  ferrous sulfate 325 (65 FE) MG tablet Take 1 tablet (325 mg total) by mouth 2 (two) times daily with a meal. 07/26/18   Cheral MarkerBooker, Kimberly R, CNM  ibuprofen (ADVIL) 400 MG tablet Take 1 tablet (400 mg total) by mouth every 6 (six) hours as needed. 10/13/18   Aviva SignsWilliams, Marie L, CNM  nitrofurantoin, macrocrystal-monohydrate, (MACROBID) 100 MG capsule Take 1 capsule (100 mg total) by mouth 2 (two) times daily. X 7 days 09/29/18   Cheral MarkerBooker, Kimberly R, CNM  oxyCODONE-acetaminophen (PERCOCET/ROXICET) 5-325 MG tablet Take 1-2 tablets by mouth every 6 (six) hours as needed for severe pain. 10/13/18   Aviva SignsWilliams, Marie L, CNM  Prenatal Vit-Fe Fumarate-FA (PNV PRENATAL PLUS MULTIVITAMIN) 27-1 MG TABS Take 1 daily 01/17/18   Adline PotterGriffin, Jennifer A, NP    Family History Family History  Problem Relation Age  of Onset  . Thyroid disease Maternal Aunt   . Thyroid disease Maternal Grandmother   . Stroke Maternal Grandmother   . Heart disease Maternal Grandmother   . Hyperlipidemia Maternal Grandmother   . Hypertension Maternal Grandmother   . Cancer Maternal Grandfather        skin  . Anesthesia problems Neg Hx     Social History Social History   Tobacco Use  . Smoking status: Never Smoker  . Smokeless tobacco: Never Used  Substance Use Topics  . Alcohol use: No  . Drug use: No     Allergies   Patient has no known allergies.   Review of Systems Review of Systems  Constitutional: Negative for activity change, appetite change and fever.  HENT: Negative for congestion.   Eyes: Negative for visual disturbance.  Respiratory: Negative for cough, chest tightness and shortness of breath.    Gastrointestinal: Positive for abdominal pain. Negative for nausea and vomiting.  Genitourinary: Positive for vaginal bleeding and vaginal pain. Negative for dysuria and hematuria.  Musculoskeletal: Negative for arthralgias and joint swelling.  Skin: Negative for rash.  Neurological: Negative for weakness and headaches.    all other systems are negative except as noted in the HPI and PMH.    Physical Exam Updated Vital Signs BP 119/78 (BP Location: Left Arm)   Pulse (!) 107   Temp 98 F (36.7 C) (Oral)   Resp 17   Ht  (1.702 m)   Wt 104.3 kg   LMP 05/31/2018 Comment: pt miscarried today   SpO2 100%   Breastfeeding Unknown   BMI 36.02 kg/m   Physical Exam Vitals signs and nursing note reviewed.  Constitutional:      General: She is not in acute distress.    Appearance: She is well-developed. She is obese.     Comments: tearful  HENT:     Head: Normocephalic and atraumatic.     Mouth/Throat:     Pharynx: No oropharyngeal exudate.  Eyes:     Conjunctiva/sclera: Conjunctivae normal.     Pupils: Pupils are equal, round, and reactive to light.  Neck:     Musculoskeletal: Normal range of motion and neck supple.     Comments: No meningismus. Cardiovascular:     Rate and Rhythm: Regular rhythm. Tachycardia present.     Heart sounds: Normal heart sounds. No murmur.     Comments: Heart rate 100s to 110s Pulmonary:     Effort: Pulmonary effort is normal. No respiratory distress.     Breath sounds: Normal breath sounds.  Abdominal:     Palpations: Abdomen is soft.     Tenderness: There is abdominal tenderness. There is no guarding or rebound.     Comments: Suprapubic tenderness  Genitourinary:    Comments: Chaperone present.  Some dark blood in vaginal vault with clots, no apparent vaginal tears.  No significant active bleeding Musculoskeletal: Normal range of motion.        General: No tenderness.  Skin:    General: Skin is warm.     Capillary Refill: Capillary  refill takes less than 2 seconds.  Neurological:     General: No focal deficit present.     Mental Status: She is alert and oriented to person, place, and time. Mental status is at baseline.     Cranial Nerves: No cranial nerve deficit.     Motor: No abnormal muscle tone.     Coordination: Coordination normal.     Comments: No ataxia  on finger to nose bilaterally. No pronator drift. 5/5 strength throughout. CN 2-12 intact.Equal grip strength. Sensation intact.   Psychiatric:        Behavior: Behavior normal.      ED Treatments / Results  Labs (all labs ordered are listed, but only abnormal results are displayed) Labs Reviewed  CBC WITH DIFFERENTIAL/PLATELET - Abnormal; Notable for the following components:      Result Value   WBC 15.3 (*)    RBC 3.81 (*)    Hemoglobin 10.3 (*)    HCT 31.7 (*)    RDW 17.4 (*)    Neutro Abs 13.6 (*)    Lymphs Abs 0.6 (*)    Abs Immature Granulocytes 0.08 (*)    All other components within normal limits  BASIC METABOLIC PANEL - Abnormal; Notable for the following components:   Potassium 3.3 (*)    CO2 20 (*)    Glucose, Bld 128 (*)    Calcium 8.5 (*)    All other components within normal limits  URINALYSIS, ROUTINE W REFLEX MICROSCOPIC  TYPE AND SCREEN    EKG None  Radiology No results found.  Procedures Ultrasound ED OB Pelvic Date/Time: 10/16/2018 6:57 AM Performed by: Glynn Octave, MD Authorized by: Glynn Octave, MD   Procedure details:    Indications: pregnant with vaginal bleeding     Assess:  Fetal viability   Technique:  Transabdominal obstetric (HCG+) exam   Images: archived   Study Limitations: body habitus Uterine findings:    Intrauterine pregnancy: not identified     Single gestation: not identified     Gestational sac: not identified     Yolk sac: not identified     Fetal pole: not identified     Fetal heart rate: not identified      Left ovary findings:    Left ovary:  Not visualized    Right ovary  findings:     Right ovary:  Not visualized    Other findings:    Free pelvic fluid: not identified     Free peritoneal fluid: not identified   Comments:     Blood products in uterus.    (including critical care time)  Medications Ordered in ED Medications  sodium chloride 0.9 % bolus 1,000 mL (has no administration in time range)  misoprostol (CYTOTEC) tablet 600 mcg (has no administration in time range)  morphine 4 MG/ML injection 4 mg (has no administration in time range)  ondansetron (ZOFRAN) injection 4 mg (has no administration in time range)     Initial Impression / Assessment and Plan / ED Course  I have reviewed the triage vital signs and the nursing notes.  Pertinent labs & imaging results that were available during my care of the patient were reviewed by me and considered in my medical decision making (see chart for details).       Miscarriage at 19 weeks.  Hemodynamically stable.  Some bleeding on arrival.  Patient given IV fluids, pain control, labs will be obtained. Discussed with [facility] Dr. Alysia Penna who recommends discussion with local gynecologist as well as administering p.o. Cytotec.  Patient does have nonviable fetus with her with apparently intact placenta.  Patient given IV fluids.  Tachycardia has improved to the 90s.  Hemoglobin is stable.  Blood type is Rh+ and she is not a RhoGam candidate.  Ultrasound shows apparent blood products in the uterus but no areas with Doppler flow.  Patient still with significant pain.  She remains  hemodynamically stable. Discussed with Dr. Emelda Fear who is coming to evaluate patient for possible retained products.  He agrees that her pain should be better controlled at this point. Recommends starting IV pitocin.   Dr. Emelda Fear seeing patient. Dr. Deretha Emory to followup his recommendations.   CRITICAL CARE Performed by: Glynn Octave Total critical care time: 35 minutes Critical care time was exclusive of  separately billable procedures and treating other patients. Critical care was necessary to treat or prevent imminent or life-threatening deterioration. Critical care was time spent personally by me on the following activities: development of treatment plan with patient and/or surrogate as well as nursing, discussions with consultants, evaluation of patient's response to treatment, examination of patient, obtaining history from patient or surrogate, ordering and performing treatments and interventions, ordering and review of laboratory studies, ordering and review of radiographic studies, pulse oximetry and re-evaluation of patient's condition.      Final Clinical Impressions(s) / ED Diagnoses   Final diagnoses:  Miscarriage    ED Discharge Orders    None       Twana Wileman, Jeannett Senior, MD 10/16/18 [phone]

## 2018-10-16 NOTE — ED Notes (Signed)
Dr ferguson at bedside 

## 2018-10-16 NOTE — ED Triage Notes (Signed)
Pt here by [facility], pt was [redacted] weeks pregnant, pt reports around 0400 she had abd cramps and had miscarriage, pt reports the fetus and placenta came out in the toilet, pt still having vaginal bleeding, per [facility] v/s stable upon arrival, pt given about 25ml NS en route

## 2018-10-16 NOTE — ED Notes (Signed)
[facility]

## 2018-10-16 NOTE — Consult Note (Signed)
Reason for Consult:spontaneous Ab , persistent pain due to clots. Referring Physician: Rancour. MD ED physician  Julia Padilla is an 43 y.o. female. She is a [id number] At 3252w5d who presents with spontaneous expulsion of fetus , placenta, and gestational sac, (malodorous), with persistent pain that appears related to retained clots in lower uterine segment and cervix. She has required iv dilaudid 1 mg x2, MS x 4 mg, and I have evacuated some clot s from in the cervix with ring forceps. We are now administering pitocin and cytotec, and hopefully she can expel the remaining clots without further continued blood loss. I am keeping pt NPO, and if clots not expelled and pain resolved in 3 hours, will take to OR for D&C.  Bedside u/s by me and Dr Manus Gunningancour shows no large areas of clot or tissue in the upper uterus, though firm tissue c/w clots is in lower ut segment. I have inspected the POC's and the fetus is intact, macerated, has been expired a while, not immediately recent expiration,  And membranes are discolored, and placenta appears intact.   Pertinent Gynecological History: Menses:  Bleeding: 500 cc + removed so far. Contraception:  DES exposure:  Blood transfusions:  Sexually transmitted diseases: no past history Previous GYN Procedures: .  Last mammogram:  Date:  Last pap:  Date: normal OB History: , P   Menstrual History: Menarche age:  Patient's last menstrual period was 05/31/2018.    Past Medical History:  Diagnosis Date  . Anemia, iron deficiency 07/09/2016  . Bacterial vaginosis   . No pertinent past medical history   . Ovarian cyst   . UTI (lower urinary tract infection)     Past Surgical History:  Procedure Laterality Date  . CESAREAN SECTION     2000 and 2010  . DILATION AND CURETTAGE OF UTERUS      Family History  Problem Relation Age of Onset  . Thyroid disease Maternal Aunt   . Thyroid disease Maternal Grandmother   . Stroke Maternal Grandmother   .  Heart disease Maternal Grandmother   . Hyperlipidemia Maternal Grandmother   . Hypertension Maternal Grandmother   . Cancer Maternal Grandfather        skin  . Anesthesia problems Neg Hx     Social History:  reports that she has never smoked. She has never used smokeless tobacco. She reports that she does not drink alcohol or use drugs.  Allergies: No Known Allergies  Medications: I have reviewed the patient's current medications.  ROS  Blood pressure 132/82, pulse 88, temperature 98 F (36.7 C), temperature source Oral, resp. rate 17, height 5\' 7"  (1.702 m), weight 104.3 kg, last menstrual period 05/31/2018, SpO2 100 %, unknown if currently breastfeeding. Physical Exam  Constitutional: She is oriented to person, place, and time. She appears well-developed and well-nourished. She appears distressed.  HENT:  Head: Normocephalic and atraumatic.  Eyes: Pupils are equal, round, and reactive to light.  Cardiovascular: Normal rate.  Respiratory: Effort normal.  GI: Soft.  Genitourinary:    Genitourinary Comments: U/s : small endometrial stripe, lots of clots in lower ut segment. Adnexa normal Speculum Vagina: right sidewall cyst obscuring view Bimanual: cx 2 + cm with firm clots,  Ring forceps: able to extract some clots, and flow of 100 cc of old dark blood released.   Musculoskeletal: Normal range of motion.  Neurological: She is alert and oriented to person, place, and time. She has normal reflexes.  Skin: Skin is warm.  Psychiatric: She has a normal mood and affect. Her behavior is normal. Thought content normal.    Results for orders placed or performed during the hospital encounter of 10/16/18 (from the past 48 hour(s))  CBC with Differential/Platelet     Status: Abnormal   Collection Time: 10/16/18  5:44 AM  Result Value Ref Range   WBC 15.3 (H) 4.0 - 10.5 K/uL   RBC 3.81 (L) 3.87 - 5.11 MIL/uL   Hemoglobin 10.3 (L) 12.0 - 15.0 g/dL   HCT 09.3 (L) 26.7 - 12.4 %   MCV  83.2 80.0 - 100.0 fL   MCH 27.0 26.0 - 34.0 pg   MCHC 32.5 30.0 - 36.0 g/dL   RDW 58.0 (H) 99.8 - 33.8 %   Platelets 185 150 - 400 K/uL   nRBC 0.0 0.0 - 0.2 %   Neutrophils Relative % 89 %   Neutro Abs 13.6 (H) 1.7 - 7.7 K/uL   Lymphocytes Relative 4 %   Lymphs Abs 0.6 (L) 0.7 - 4.0 K/uL   Monocytes Relative 6 %   Monocytes Absolute 1.0 0.1 - 1.0 K/uL   Eosinophils Relative 0 %   Eosinophils Absolute 0.1 0.0 - 0.5 K/uL   Basophils Relative 0 %   Basophils Absolute 0.0 0.0 - 0.1 K/uL   Immature Granulocytes 1 %   Abs Immature Granulocytes 0.08 (H) 0.00 - 0.07 K/uL    Comment: Performed at [facility], [address], [place], [place] [address]  Basic metabolic panel     Status: Abnormal   Collection Time: 10/16/18  5:44 AM  Result Value Ref Range   Sodium 135 135 - 145 mmol/L   Potassium 3.3 (L) 3.5 - 5.1 mmol/L   Chloride 104 98 - 111 mmol/L   CO2 20 (L) 22 - 32 mmol/L   Glucose, Bld 128 (H) 70 - 99 mg/dL   BUN 9 6 - 20 mg/dL   Creatinine, Ser 9.76 0.44 - 1.00 mg/dL   Calcium 8.5 (L) 8.9 - 10.3 mg/dL   GFR calc non Af Amer >60 >60 mL/min   GFR calc Af Amer >60 >60 mL/min   Anion gap 11 5 - 15    Comment: Performed at [facility], [address] [place], [place] [address]  Type and screen [facility]     Status: None   Collection Time: 10/16/18  5:44 AM  Result Value Ref Range   ABO/RH(D) O POS    Antibody Screen NEG    Sample Expiration      10/19/2018 Performed at [facility], [address], [place], [place] [address]     No results found.  Assessment/Plan: Spont Ab. Retained blood clots in lower ut segment. Will observe x 3+ hrs, if not able to expel clots,  Will go to OR.    Julia Padilla 10/16/2018

## 2018-10-16 NOTE — ED Notes (Signed)
Dr Emelda Fear at bedside

## 2018-10-16 NOTE — Consult Note (Signed)
Followup assessment.  Pt has rated pain at a 3-5, pain described as 'better". Has passed few clots, and bleeding has essentially stopped. BP (!) 101/56   Pulse (!) 101   Temp 98 F (36.7 C) (Oral)   Resp 17   Ht 5\' 7"  (1.702 m)   Wt 104.3 kg   LMP 05/31/2018 Comment: pt miscarried today   SpO2 98%   Breastfeeding Unknown   BMI 36.02 kg/m   CBC Latest Ref Rng & Units 10/16/2018 10/16/2018 08/07/2018  WBC 4.0 - 10.5 K/uL 15.2(H) 15.3(H) 7.1  Hemoglobin 12.0 - 15.0 g/dL 2.4(V) 10.3(L) 10.6(L)  Hematocrit 36.0 - 46.0 % 27.5(L) 31.7(L) 35.4(L)  Platelets 150 - 400 K/uL 179 185 270    Discussed options: pt and I agree that she's stable to avoid removal of clots, and so will send home on cytotec 200 mcg bid, Flagyl  and f/u 3 days in the office. Pt to call for appt.

## 2018-10-16 NOTE — Discharge Instructions (Signed)
You may reach Dr Emelda Fear on his cell if needed this week at [phone].   Miscarriage A miscarriage is the loss of an unborn baby (fetus) before the 20th week of pregnancy. Most miscarriages happen during the first 3 months of pregnancy. Sometimes, a miscarriage can happen before a woman knows that she is pregnant. Having a miscarriage can be an emotional experience. If you have had a miscarriage, talk with your health care provider about any questions you may have about miscarrying, the grieving process, and your plans for future pregnancy. What are the causes? A miscarriage may be caused by:  Problems with the genes or chromosomes of the fetus. These problems make it impossible for the baby to develop normally. They are often the result of random errors that occur early in the development of the baby, and are not passed from parent to child (not inherited).  Infection of the cervix or uterus.  Conditions that affect hormone balance in the body.  Problems with the cervix, such as the cervix opening and thinning before pregnancy is at term (cervical insufficiency).  Problems with the uterus. These may include: ? A uterus with an abnormal shape. ? Fibroids in the uterus. ? Congenital abnormalities. These are problems that were present at birth.  Certain medical conditions.  Smoking, drinking alcohol, or using drugs.  Injury (trauma). In many cases, the cause of a miscarriage is not known. What are the signs or symptoms? Symptoms of this condition include:  Vaginal bleeding or spotting, with or without cramps or pain.  Pain or cramping in the abdomen or lower back.  Passing fluid, tissue, or blood clots from the vagina. How is this diagnosed? This condition may be diagnosed based on:  A physical exam.  Ultrasound.  Blood tests.  Urine tests. How is this treated? Treatment for a miscarriage is sometimes not necessary if you naturally pass all the tissue that was in your  uterus. If necessary, this condition may be treated with:  Dilation and curettage (D&C). This is a procedure in which the cervix is stretched open and the lining of the uterus (endometrium) is scraped. This is done only if tissue from the fetus or placenta remains in the body (incomplete miscarriage).  Medicines, such as: ? Antibiotic medicine, to treat infection. ? Medicine to help the body pass any remaining tissue. ? Medicine to reduce (contract) the size of the uterus. These medicines may be given if you have a lot of bleeding. If you have Rh negative blood and your baby was Rh positive, you will need a shot of a medicine called Rh immunoglobulinto protect your future babies from Rh blood problems. "Rh-negative" and "Rh-positive" refer to whether or not the blood has a specific protein found on the surface of red blood cells (Rh factor). Follow these instructions at home: Medicines   Take over-the-counter and prescription medicines only as told by your health care provider.  If you were prescribed antibiotic medicine, take it as told by your health care provider. Do not stop taking the antibiotic even if you start to feel better.  Do not take NSAIDs, such as aspirin and ibuprofen, unless they are approved by your health care provider. These medicines can cause bleeding. Activity  Rest as directed. Ask your health care provider what activities are safe for you.  Have someone help with home and family responsibilities during this time. General instructions  Keep track of the number of sanitary pads you use each day and how soaked (saturated)  they are. Write down this information.  Monitor the amount of tissue or blood clots that you pass from your vagina. Save any large amounts of tissue for your health care provider to examine.  Do not use tampons, douche, or have sex until your health care provider approves.  To help you and your partner with the process of grieving, talk with your  health care provider or seek counseling.  When you are ready, meet with your health care provider to discuss any important steps you should take for your health. Also, discuss steps you should take to have a healthy pregnancy in the future.  Keep all follow-up visits as told by your health care provider. This is important. Where to find more information  The American Congress of Obstetricians and Gynecologists: www.acog.org  U.S. Department of Health and CytogeneticistHuman Services Office of Womens Health: http://hoffman.com/www.womenshealth.gov Contact a health care provider if:  You have a fever or chills.  You have a foul smelling vaginal discharge.  You have more bleeding instead of less. Get help right away if:  You have severe cramps or pain in your back or abdomen.  You pass blood clots or tissue from your vagina that is walnut-sized or larger.  You soak more than 1 regular sanitary pad in an hour.  You become light-headed or weak.  You pass out.  You have feelings of sadness that take over your thoughts, or you have thoughts of hurting yourself. Summary  Most miscarriages happen in the first 3 months of pregnancy. Sometimes miscarriage happens before a woman even knows that she is pregnant.  Follow your health care provider's instruction for home care. Keep all follow-up appointments.  To help you and your partner with the process of grieving, talk with your health care provider or seek counseling. This information is not intended to replace advice given to you by your health care provider. Make sure you discuss any questions you have with your health care provider. Document Released: 12/09/2000 Document Revised: 07/21/2016 Document Reviewed: 07/21/2016 Elsevier Interactive Patient Education  2019 [employer]

## 2018-10-18 ENCOUNTER — Telehealth: Payer: Self-pay | Admitting: *Deleted

## 2018-10-18 NOTE — Telephone Encounter (Signed)
mychart message with restrictions 

## 2018-10-19 ENCOUNTER — Other Ambulatory Visit: Payer: Self-pay | Admitting: Obstetrics and Gynecology

## 2018-10-19 ENCOUNTER — Other Ambulatory Visit: Payer: Self-pay

## 2018-10-19 ENCOUNTER — Ambulatory Visit (INDEPENDENT_AMBULATORY_CARE_PROVIDER_SITE_OTHER): Payer: Medicaid Other | Admitting: Obstetrics and Gynecology

## 2018-10-19 ENCOUNTER — Other Ambulatory Visit (INDEPENDENT_AMBULATORY_CARE_PROVIDER_SITE_OTHER): Payer: Medicaid Other

## 2018-10-19 ENCOUNTER — Encounter: Payer: Self-pay | Admitting: Obstetrics and Gynecology

## 2018-10-19 VITALS — BP 134/90 | HR 90 | Temp 97.8°F | Ht 67.0 in | Wt 227.0 lb

## 2018-10-19 DIAGNOSIS — O0380 Unspecified complication following complete or unspecified spontaneous abortion: Secondary | ICD-10-CM | POA: Diagnosis not present

## 2018-10-19 DIAGNOSIS — O0990 Supervision of high risk pregnancy, unspecified, unspecified trimester: Secondary | ICD-10-CM

## 2018-10-19 DIAGNOSIS — Z3A19 19 weeks gestation of pregnancy: Secondary | ICD-10-CM | POA: Diagnosis not present

## 2018-10-19 NOTE — Progress Notes (Addendum)
Patient ID: Julia Padilla, female   DOB: 06/22/1976, 43 y.o.   MRN: [id number]    [facility]  @DATE @            Patient name: Julia Padilla MRN [id number]  Date of birth: 06/22/1976  CC & HPI:  Julia Padilla is a 43 y.o. female presenting today for E/r follow up of miscarriage 10/16/2018. Is still having vaginal bleeding. She is still cramping and passing small clots, has been taking the Cytotec which causes cramping but no passage of large clots describes color as normal dark red color. Changed pads frequently throughout the day is unsure of how many she uses in a day.  Husband is still struggling with loss of pregnancy, she says that husband plans to keep his feelings to himself and deal with situation on his own.   ROS:  ROS +vaginal bleeding +crampring +SAB +vaginal cyst -fever -chills  All systems are negative except as noted in the HPI and PMH.    Pertinent History Reviewed:   Reviewed: Significant for SAB Medical         Past Medical History:  Diagnosis Date  . Anemia, iron deficiency 07/09/2016  . Bacterial vaginosis   . No pertinent past medical history   . Ovarian cyst   . UTI (lower urinary tract infection)                               Surgical Hx:    Past Surgical History:  Procedure Laterality Date  . CESAREAN SECTION     2000 and 2010  . DILATION AND CURETTAGE OF UTERUS     Medications: Reviewed & Updated - see associated section                       Current Outpatient Medications:  .  misoprostol (CYTOTEC) 200 MCG tablet, One po twice daily x 3 day to promote cramping., Disp: 6 tablet, Rfl: 0 .  amoxicillin (AMOXIL) 500 MG capsule, Take 1 capsule (500 mg total) by mouth every 8 (eight) hours. (Patient not taking: Reported on 10/19/2018), Disp: 21 capsule, Rfl: 0 .  aspirin 81 MG chewable tablet, Chew 81 mg by mouth daily., Disp: , Rfl:  .  ferrous sulfate 325 (65 FE) MG tablet, Take 1 tablet (325 mg total) by mouth 2 (two) times  daily with a meal. (Patient not taking: Reported on 10/19/2018), Disp: 60 tablet, Rfl: 3 .  ibuprofen (ADVIL) 400 MG tablet, Take 1 tablet (400 mg total) by mouth every 6 (six) hours as needed. (Patient not taking: Reported on 10/19/2018), Disp: 30 tablet, Rfl: 0 .  Prenatal Vit-Fe Fumarate-FA (PNV PRENATAL PLUS MULTIVITAMIN) 27-1 MG TABS, Take 1 daily (Patient not taking: Reported on 10/19/2018), Disp: 30 tablet, Rfl: 12   Social History: Reviewed -  reports that she has never smoked. She has never used smokeless tobacco.  Objective Findings:  Vitals: Blood pressure 134/90, pulse 90, temperature 97.8 F (36.6 C), height 5\' 7"  (1.702 m), weight 227 lb (103 kg), last menstrual period 05/31/2018, unknown if currently breastfeeding.  PHYSICAL EXAMINATION General appearance - alert, well appearing, and in no distress Mental status - alert, oriented to person, place, and time, tearful, depressed mood appropriate behavior, speech, dress, motor activity, and thought processes  PELVIC exam requires use of a long large snowman speculum with vaginal probe cover over it to the vaginal  sidewall cyst from obscuring the cervix Vagina -vaginal clots, speculum used break apart & help remove some. Vaginal side wall cyst, right makes visualization of cervix difficult   cervix - cannot visualized due to clots.  On digital exam the cervix is still essentially 100% effaced and 3 cm of clots in the external office.  I used ring forceps to extract clots I have been able to extract probably 100 cc of firm hard clots from the lower uterine segment no visible visible tissue will order vaginal ultrasound today Vaginal ultrasound obtained and personally reviewed by me.  She has some avascular clots in the cervix and lower uterine segment, the same ones of been trying to disrupt and extract In the posterior lower uterine segment there is a 5 mm thick area there that may have some blood flow.  This raises the possibility of a  residual cotyledon.  Since she is not bleeding actively with fresh blood but simply the old clots, we have discussed the option of proceeding to [facility] today versus continued expectant management and follow-up next week.  The patient specifically declines the offer of suction D&C at this point and would prefer expectant management will have patient follow-up next week this office, with Dr. Despina Hidden. Assessment & Plan:   A:  1. SAB 2. Vaginal clots, due to miscarriage cannot completely rule out posterior retained placental tissue fragment 3. Vaginal cyst, right, limiting good view of cervix  P:  1. TV u/s today, as noted above 2. Patient declines consideration of D&C at this point wants expectant management we will follow-up 1 week this office     By signing my name below, I, Arnette Norris, attest that this documentation has been prepared under the direction and in the presence of Tilda Burrow, MD. Electronically Signed: Arnette Norris Medical Scribe. 10/19/18. 9:54 AM.  I personally performed the services described in this documentation, which was SCRIBED in my presence. The recorded information has been reviewed and considered accurate. It has been edited as necessary during review. Tilda Burrow, MD

## 2018-10-19 NOTE — Progress Notes (Signed)
PELVIC T/A MO:[id number] anteverted uterus,2 x 1.9 x 2.1 cm complex cervical cyst,complex thicken endometrium 35 mm with color flow in the lower uterine segment,no free fluid,normal right ovary,left ovary not visualized,reviewed images w/Dr Emelda Fear

## 2018-10-24 ENCOUNTER — Encounter: Payer: Self-pay | Admitting: *Deleted

## 2018-10-25 ENCOUNTER — Other Ambulatory Visit: Payer: Self-pay

## 2018-10-25 ENCOUNTER — Ambulatory Visit: Payer: Medicaid Other | Admitting: Obstetrics & Gynecology

## 2018-10-25 ENCOUNTER — Telehealth: Payer: Self-pay | Admitting: Obstetrics & Gynecology

## 2018-10-25 ENCOUNTER — Encounter: Payer: Self-pay | Admitting: Obstetrics & Gynecology

## 2018-10-25 VITALS — BP 125/79 | HR 112 | Wt 224.0 lb

## 2018-10-25 DIAGNOSIS — O0380 Unspecified complication following complete or unspecified spontaneous abortion: Secondary | ICD-10-CM | POA: Diagnosis not present

## 2018-10-25 DIAGNOSIS — O0330 Unspecified complication following incomplete spontaneous abortion: Secondary | ICD-10-CM

## 2018-10-25 MED ORDER — AMOXICILLIN-POT CLAVULANATE 875-125 MG PO TABS: 1.0000 | ORAL_TABLET | Freq: Two times a day (BID) | ORAL | 0 refills | Status: DC

## 2018-10-25 MED ORDER — MISOPROSTOL 200 MCG PO TABS: 600.0000 ug | ORAL_TABLET | Freq: Three times a day (TID) | ORAL | 0 refills | Status: DC

## 2018-10-25 NOTE — Addendum Note (Signed)
Addended by: Sherre Lain A on: 10/25/2018 11:27 AM   Modules accepted: Orders

## 2018-10-25 NOTE — Telephone Encounter (Signed)
Retained placental fragment and it is not high dose, standard dosing for this issue

## 2018-10-25 NOTE — Telephone Encounter (Signed)
Called pharmacy back and advised dose correct.

## 2018-10-25 NOTE — Telephone Encounter (Signed)
Julia Padilla with [facility] calling to see the reason for the high dose of the CYTOTEC before they can fill the medicaiton.

## 2018-10-25 NOTE — Progress Notes (Signed)
Chief Complaint  Patient presents with  . Follow-up      43 y.o. [id number] No LMP recorded. (Menstrual status: Other). The current method of family planning is none.  Outpatient Encounter Medications as of 10/25/2018  Medication Sig  . ibuprofen (ADVIL) 400 MG tablet Take 1 tablet (400 mg total) by mouth every 6 (six) hours as needed.  Marland Kitchen. amoxicillin (AMOXIL) 500 MG capsule Take 1 capsule (500 mg total) by mouth every 8 (eight) hours. (Patient not taking: Reported on 10/19/2018)  . amoxicillin-clavulanate (AUGMENTIN) 875-125 MG tablet Take 1 tablet by mouth 2 (two) times daily.  Marland Kitchen. aspirin 81 MG chewable tablet Chew 81 mg by mouth daily.  . ferrous sulfate 325 (65 FE) MG tablet Take 1 tablet (325 mg total) by mouth 2 (two) times daily with a meal. (Patient not taking: Reported on 10/19/2018)  . misoprostol (CYTOTEC) 200 MCG tablet Take 3 tablets (600 mcg total) by mouth 3 (three) times daily. For 3 days  . Prenatal Vit-Fe Fumarate-FA (PNV PRENATAL PLUS MULTIVITAMIN) 27-1 MG TABS Take 1 daily (Patient not taking: Reported on 10/19/2018)  . [DISCONTINUED] misoprostol (CYTOTEC) 200 MCG tablet One po twice daily x 3 day to promote cramping. (Patient not taking: Reported on 10/25/2018)   No facility-administered encounter medications on file as of 10/25/2018.     Subjective Pt is seen for follow up of complications from 2nd trimester pregnancy loss due to cervical incompetence, the pregnancy was T21  She was thought to have a retained placenta fragment/cotelydon and she has continued bleeding  On exam there was a fragment at the cervical os present Past Medical History:  Diagnosis Date  . Anemia, iron deficiency 07/09/2016  . Bacterial vaginosis   . No pertinent past medical history   . Ovarian cyst   . UTI (lower urinary tract infection)     Past Surgical History:  Procedure Laterality Date  . CESAREAN SECTION     2000 and 2010  . DILATION AND CURETTAGE OF UTERUS      OB  History    Gravida  5   Para  2   Term  2   Preterm  0   AB  2   Living  2     SAB  1   TAB  0   Ectopic  0   Multiple  0   Live Births  2           No Known Allergies  Social History   Socioeconomic History  . Marital status: Divorced    Spouse name: engaged -Chief Operating OfficerMarcellus  . Number of children: 2  . Years of education: 1112  . Highest education level: Not on file  Occupational History  . Occupation: Airline pilotsales    Comment: [employer]  Social Needs  . Financial resource strain: Not hard at all  . Food insecurity:    Worry: Never true    Inability: Never true  . Transportation needs:    Medical: No    Non-medical: No  Tobacco Use  . Smoking status: Never Smoker  . Smokeless tobacco: Never Used  Substance and Sexual Activity  . Alcohol use: No  . Drug use: No  . Sexual activity: Not Currently    Birth control/protection: None  Lifestyle  . Physical activity:    Days per week: 3 days    Minutes per session: 30 min  . Stress: Not at all  Relationships  . Social connections:    Talks  on phone: More than three times a week    Gets together: Once a week    Attends religious service: 1 to 4 times per year    Active member of club or organization: No    Attends meetings of clubs or organizations: Never    Relationship status: Living with partner  Other Topics Concern  . Not on file  Social History Narrative   Divorced   Two children   Engaged to [employer] Geographical information systems officer)    Family History  Problem Relation Age of Onset  . Thyroid disease Maternal Aunt   . Thyroid disease Maternal Grandmother   . Stroke Maternal Grandmother   . Heart disease Maternal Grandmother   . Hyperlipidemia Maternal Grandmother   . Hypertension Maternal Grandmother   . Cancer Maternal Grandfather        skin  . Anesthesia problems Neg Hx     Medications:       Current Outpatient Medications:  .  ibuprofen (ADVIL) 400 MG tablet, Take 1 tablet (400 mg total) by mouth every 6 (six)  hours as needed., Disp: 30 tablet, Rfl: 0 .  amoxicillin (AMOXIL) 500 MG capsule, Take 1 capsule (500 mg total) by mouth every 8 (eight) hours. (Patient not taking: Reported on 10/19/2018), Disp: 21 capsule, Rfl: 0 .  amoxicillin-clavulanate (AUGMENTIN) 875-125 MG tablet, Take 1 tablet by mouth 2 (two) times daily., Disp: 20 tablet, Rfl: 0 .  aspirin 81 MG chewable tablet, Chew 81 mg by mouth daily., Disp: , Rfl:  .  ferrous sulfate 325 (65 FE) MG tablet, Take 1 tablet (325 mg total) by mouth 2 (two) times daily with a meal. (Patient not taking: Reported on 10/19/2018), Disp: 60 tablet, Rfl: 3 .  misoprostol (CYTOTEC) 200 MCG tablet, Take 3 tablets (600 mcg total) by mouth 3 (three) times daily. For 3 days, Disp: 27 tablet, Rfl: 0 .  Prenatal Vit-Fe Fumarate-FA (PNV PRENATAL PLUS MULTIVITAMIN) 27-1 MG TABS, Take 1 daily (Patient not taking: Reported on 10/19/2018), Disp: 30 tablet, Rfl: 12  Objective Blood pressure 125/79, pulse (!) 112, weight 224 lb (101.6 kg), unknown if currently breastfeeding.  Large fragment at cervix removed with ring forceps and sent to pathology  Pertinent ROS No burning with urination, frequency or urgency No nausea, vomiting or diarrhea Nor fever chills or other constitutional symptoms   Labs or studies No new    Impression Diagnoses this Encounter::   ICD-10-CM   1. Post-abortion complication O03.80   2. Incomplete abortion with complication O03.30    retained placental fragment    Established relevant diagnosis(es):   Plan/Recommendations: Meds ordered this encounter  Medications  . misoprostol (CYTOTEC) 200 MCG tablet    Sig: Take 3 tablets (600 mcg total) by mouth 3 (three) times daily. For 3 days    Dispense:  27 tablet    Refill:  0  . amoxicillin-clavulanate (AUGMENTIN) 875-125 MG tablet    Sig: Take 1 tablet by mouth 2 (two) times daily.    Dispense:  20 tablet    Refill:  0    Labs or Scans Ordered: No orders of the defined types were  placed in this encounter.   Management:: >reatined/passing placental fragment removed >cytotec 600 TID x 3 days >augmentin for possible endometritis complication  Follow up Return in about 1 week (around 11/01/2018) for Follow up, in person, with Dr Despina Hidden.          All questions were answered.

## 2018-10-31 ENCOUNTER — Encounter: Payer: Self-pay | Admitting: *Deleted

## 2018-11-01 ENCOUNTER — Encounter: Payer: Medicaid Other | Admitting: Obstetrics & Gynecology

## 2018-11-01 ENCOUNTER — Ambulatory Visit: Payer: Medicaid Other | Admitting: Obstetrics & Gynecology

## 2018-11-01 ENCOUNTER — Other Ambulatory Visit: Payer: Medicaid Other

## 2018-11-03 ENCOUNTER — Other Ambulatory Visit: Payer: Self-pay | Admitting: Obstetrics & Gynecology

## 2018-11-03 MED ORDER — DESOGESTREL-ETHINYL ESTRADIOL 0.15-0.02/0.01 MG (21/5) PO TABS: 1.0000 | ORAL_TABLET | Freq: Every day | ORAL | 11 refills | Status: DC

## 2018-11-15 ENCOUNTER — Telehealth: Payer: Self-pay | Admitting: Obstetrics & Gynecology

## 2018-11-15 NOTE — Telephone Encounter (Signed)
Pt is requesting a call from a nurse to discuss questions she has for after a miscarriage.

## 2018-11-15 NOTE — Telephone Encounter (Signed)
Spoke with pt. Pt had a miscarriage 1 month ago. Pt was [redacted] weeks along. Pt is still bleeding and has a slimy discharge. Also passing small clots. No odor, itching or irritation. No fever. Advised to watch it and if anything changes, let us know. Pt voiced understanding. JSY

## 2019-02-16 ENCOUNTER — Telehealth: Payer: Self-pay | Admitting: Obstetrics & Gynecology

## 2019-02-16 MED ORDER — SULFAMETHOXAZOLE-TRIMETHOPRIM 800-160 MG PO TABS: 1.0000 | ORAL_TABLET | Freq: Two times a day (BID) | ORAL | 0 refills | Status: DC

## 2019-05-01 ENCOUNTER — Other Ambulatory Visit: Payer: Self-pay | Admitting: Women's Health

## 2019-05-01 MED ORDER — NITROFURANTOIN MONOHYD MACRO 100 MG PO CAPS: 100.0000 mg | ORAL_CAPSULE | Freq: Two times a day (BID) | ORAL | 0 refills | Status: DC

## 2020-02-22 IMAGING — US US OB TRANSVAGINAL
1 series · 14 of 28 positions shown · non-contrast
Comparison: CT abdomen pelvis 05/16/2014

CLINICAL DATA: Assess for viability.

EXAM:
OBSTETRIC <14 WK ULTRASOUND
TECHNIQUE: Transabdominal ultrasound was performed for evaluation of the
gestation as well as the maternal uterus and adnexal regions.

[Series 1: us ob transvaginal · 0.28mm/px · 14 of 64 slices shown]
[im 3/64]
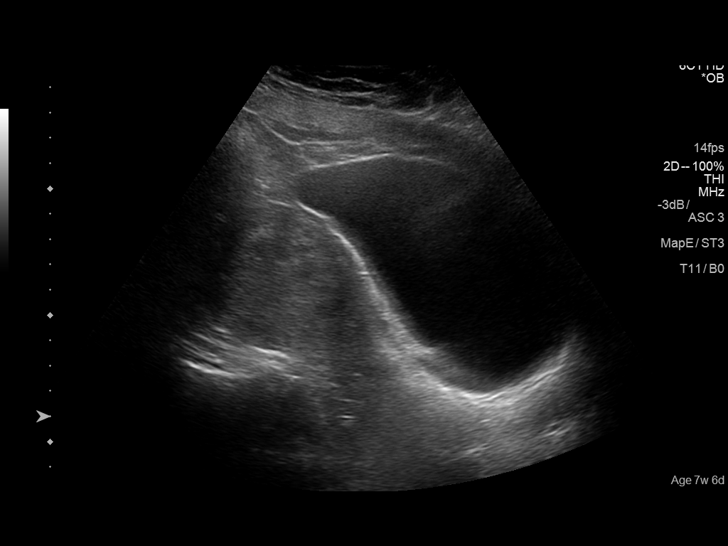
[im 8/64]
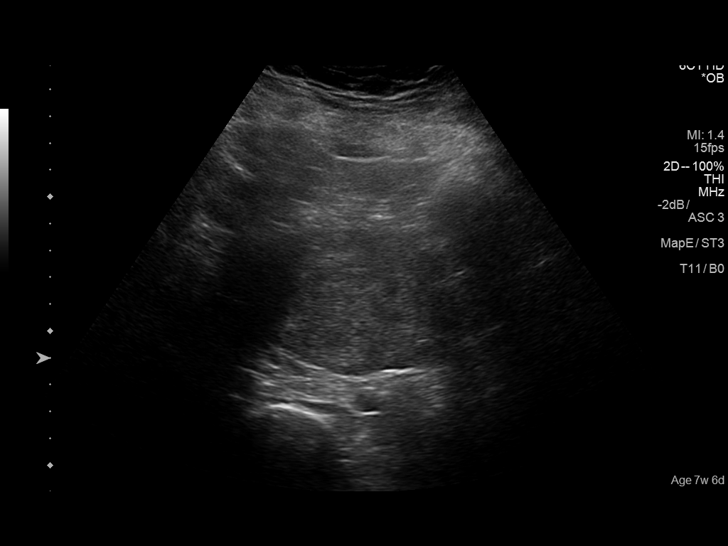
[im 12/64]
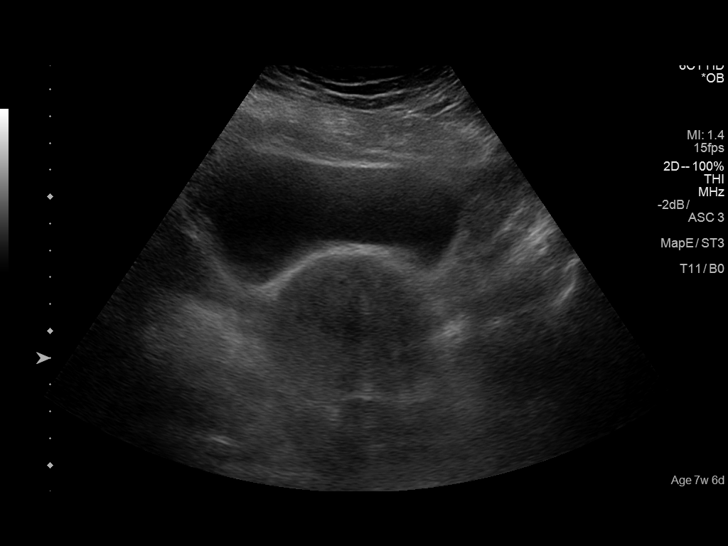
[im 17/64]
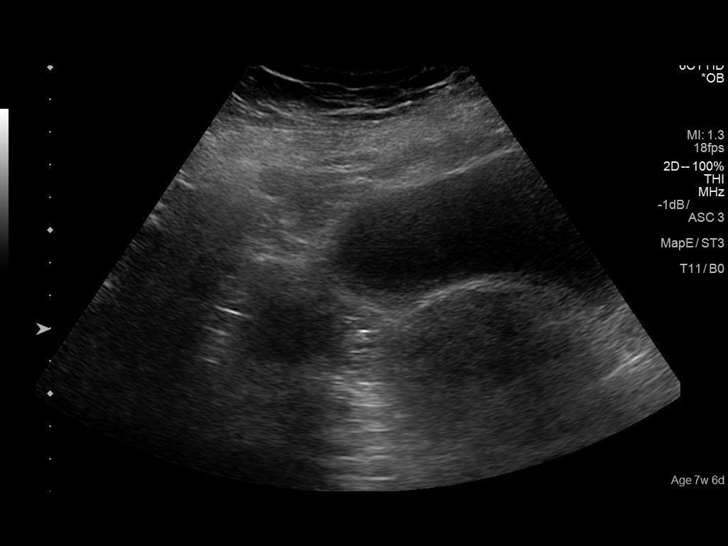
[im 22/64]
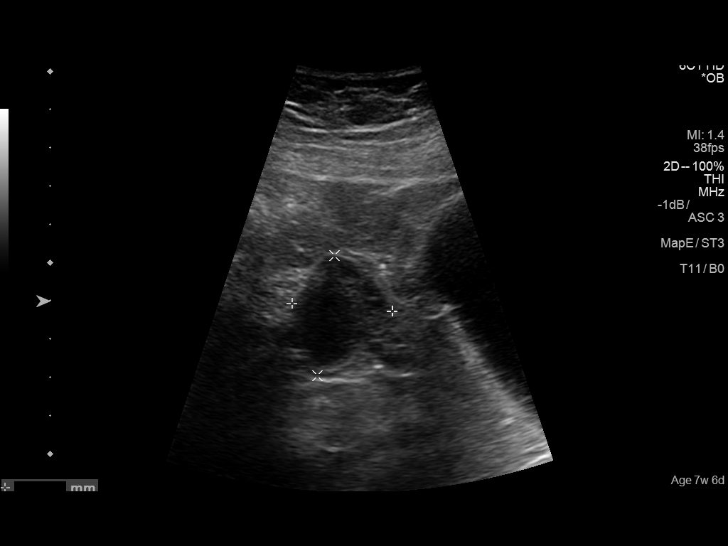
[im 26/64]
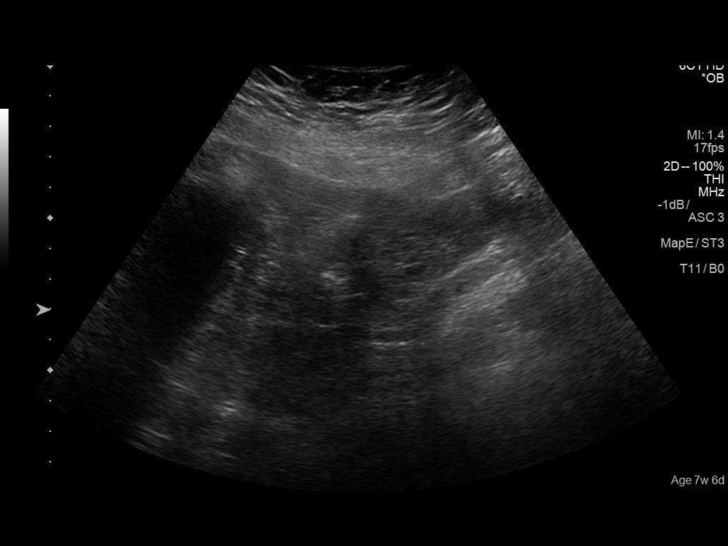
[im 31/64]
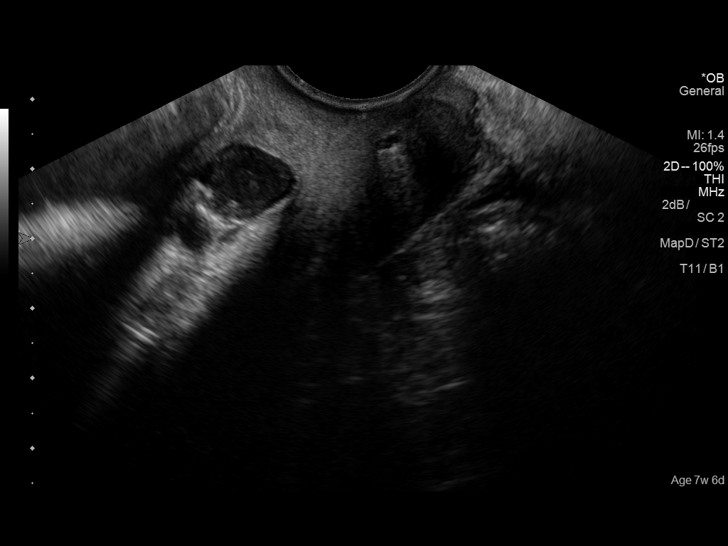
[im 36/64]
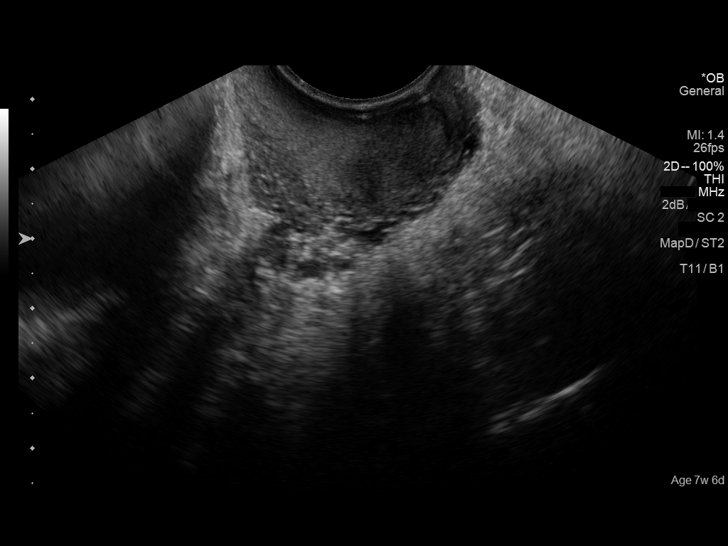
[im 40/64]
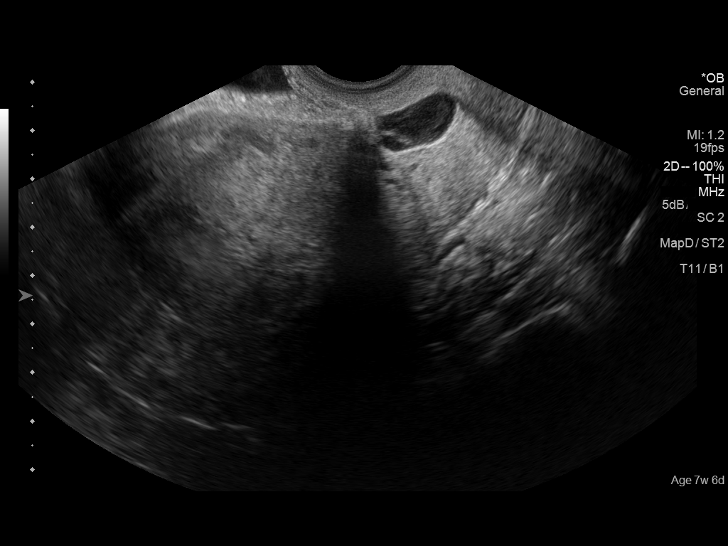
[im 45/64]
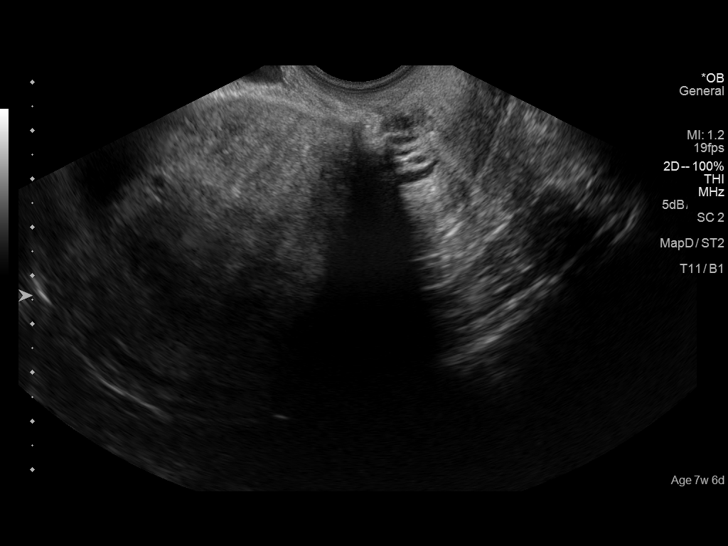
[im 50/64]
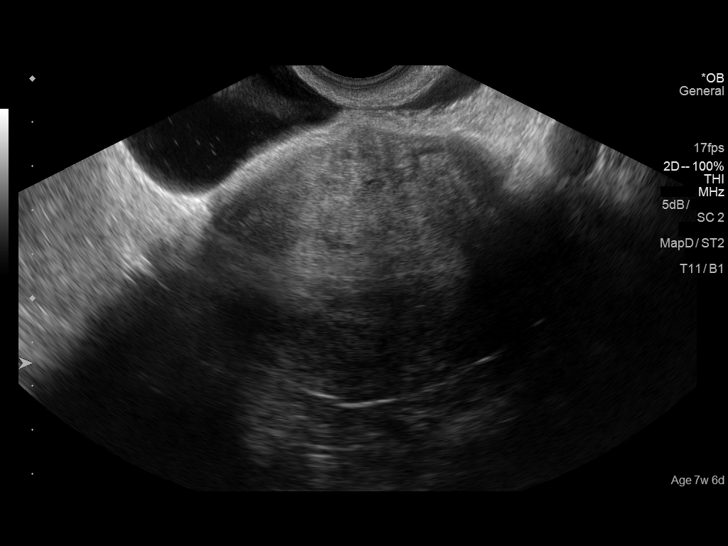
[im 54/64]
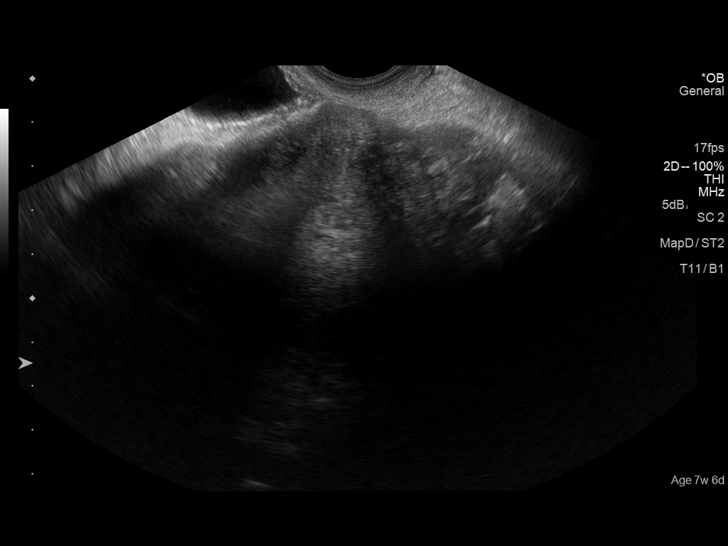
[im 59/64]
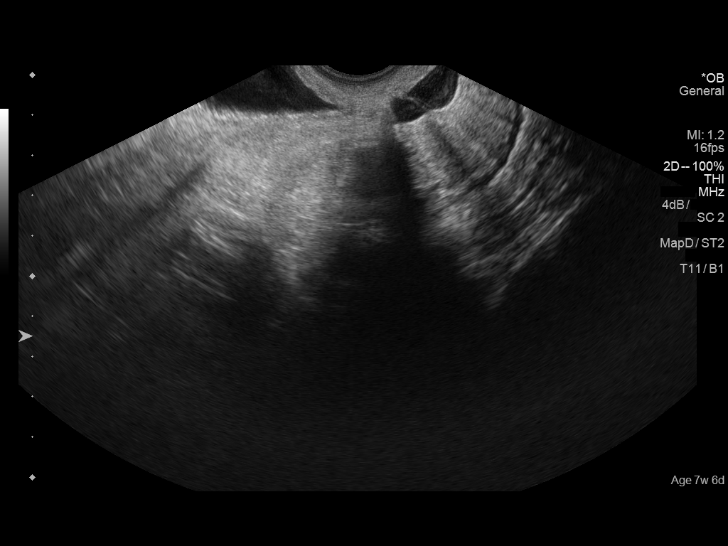
[im 64/64]
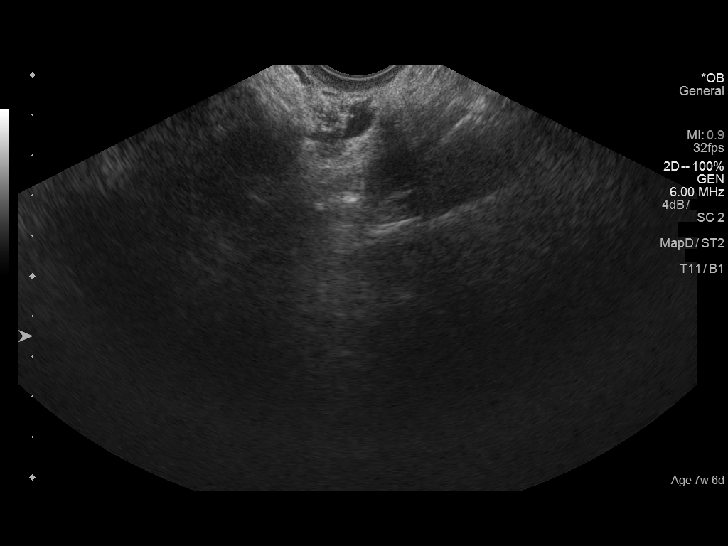

[14 of 28 positions shown; findings below may reference images not displayed]

FINDINGS: Intrauterine gestational sac: Single

Yolk sac:  Not Visualized.

Embryo:  Not Visualized.

Cardiac Activity: Not Visualized.

MSD: 14.7 mm   6 w   2 d

Subchorionic hemorrhage:  None visualized.

Maternal uterus/adnexae: Normal appearing right ovary. Left ovary
not visualized. No free fluid in the pelvis.
IMPRESSION: Probable early intrauterine gestational sac, but no yolk sac, fetal
pole, or cardiac activity yet visualized. Recommend follow-up
quantitative B-HCG levels and follow-up US in 14 days to assess
viability. This recommendation follows SRU consensus guidelines:
Diagnostic Criteria for Nonviable Pregnancy Early in the First
Trimester. N Engl J Med 7708; [DATE].

## 2020-08-28 ENCOUNTER — Ambulatory Visit: Payer: Medicaid Other | Admitting: Nurse Practitioner

## 2020-08-30 ENCOUNTER — Other Ambulatory Visit: Payer: Self-pay

## 2020-08-30 ENCOUNTER — Ambulatory Visit (INDEPENDENT_AMBULATORY_CARE_PROVIDER_SITE_OTHER): Payer: Medicaid Other

## 2020-08-30 VITALS — BP 140/91 | HR 91 | Ht 66.5 in | Wt 217.2 lb

## 2020-08-30 DIAGNOSIS — N926 Irregular menstruation, unspecified: Secondary | ICD-10-CM | POA: Diagnosis not present

## 2020-08-30 DIAGNOSIS — Z3401 Encounter for supervision of normal first pregnancy, first trimester: Secondary | ICD-10-CM | POA: Diagnosis not present

## 2020-08-30 LAB — POCT URINE PREGNANCY: Preg Test, Ur: POSITIVE — AB

## 2020-08-30 NOTE — Progress Notes (Addendum)
   NURSE VISIT- PREGNANCY CONFIRMATION   SUBJECTIVE:  Julia Padilla is a 45 y.o. [phone] female at [redacted]w[redacted]d by certain LMP of Patient's last menstrual period was 07/14/2020 (exact date). Here for pregnancy confirmation.  Home pregnancy test: positive x 4  She reports nausea.  She is not taking prenatal vitamins.    OBJECTIVE:  BP (!) 140/91 (BP Location: Right Arm, Patient Position: Sitting, Cuff Size: Normal)   Pulse 91   Ht 5' 6.5" (1.689 m)   Wt 217 lb 3.2 oz (98.5 kg)   LMP 07/14/2020 (Exact Date)   BMI 34.53 kg/m   Appears well, in no apparent distress OB History  Gravida Para Term Preterm AB Living  6 2 2  0 2 2  SAB IAB Ectopic Multiple Live Births  1 0 0 0 2    # Outcome Date GA Lbr Len/2nd Weight Sex Delivery Anes PTL Lv  6 Current           5 SAB 01/2018 [redacted]w[redacted]d         4 Term 03/13/09 [redacted]w[redacted]d  7 lb 13 oz (3.544 kg) F CS-LTranv Spinal N LIV  3 AB 2001          2 Term 05/21/99 [redacted]w[redacted]d  8 lb 13 oz (3.997 kg) M CS-LTranv EPI N LIV     Complications: Failure to Progress in Second Stage  1 Gravida             Results for orders placed or performed in visit on 08/30/20 (from the past 24 hour(s))  POCT urine pregnancy   Collection Time: 08/30/20 11:15 AM  Result Value Ref Range   Preg Test, Ur Positive (A) Negative    ASSESSMENT: Positive pregnancy test, [redacted]w[redacted]d by LMP    PLAN: Schedule for dating ultrasound in 2 weeks Prenatal vitamins: plans to begin OTC ASAP   Nausea medicines: not currently needed   OB packet given: Yes  Norma Ignasiak A Shian Goodnow  08/30/2020 11:29 AM   Chart reviewed for nurse visit. Agree with plan of care.  10/30/2020, Cheral Marker 08/30/2020 2:13 PM

## 2020-08-31 LAB — PROGESTERONE: Progesterone: 14.2 ng/mL

## 2020-09-03 ENCOUNTER — Other Ambulatory Visit: Payer: Self-pay | Admitting: Women's Health

## 2020-09-03 MED ORDER — PROGESTERONE 200 MG PO CAPS: 200.0000 mg | ORAL_CAPSULE | Freq: Every day | ORAL | 5 refills | Status: DC

## 2020-09-08 ENCOUNTER — Other Ambulatory Visit: Payer: Self-pay

## 2020-09-08 ENCOUNTER — Encounter (HOSPITAL_COMMUNITY): Payer: Self-pay | Admitting: Emergency Medicine

## 2020-09-08 ENCOUNTER — Emergency Department (HOSPITAL_COMMUNITY): Payer: Medicaid Other

## 2020-09-08 ENCOUNTER — Emergency Department (HOSPITAL_COMMUNITY)
Admission: EM | Admit: 2020-09-08 | Discharge: 2020-09-08 | Disposition: A | Discharge status: Home / Self Care | Payer: Medicaid Other | Attending: Emergency Medicine | Admitting: Emergency Medicine

## 2020-09-08 DIAGNOSIS — R1030 Lower abdominal pain, unspecified: Secondary | ICD-10-CM | POA: Insufficient documentation

## 2020-09-08 DIAGNOSIS — Z3A09 9 weeks gestation of pregnancy: Secondary | ICD-10-CM | POA: Diagnosis not present

## 2020-09-08 DIAGNOSIS — O209 Hemorrhage in early pregnancy, unspecified: Secondary | ICD-10-CM

## 2020-09-08 DIAGNOSIS — O26891 Other specified pregnancy related conditions, first trimester: Secondary | ICD-10-CM | POA: Diagnosis not present

## 2020-09-08 DIAGNOSIS — O2691 Pregnancy related conditions, unspecified, first trimester: Secondary | ICD-10-CM | POA: Insufficient documentation

## 2020-09-08 DIAGNOSIS — O09511 Supervision of elderly primigravida, first trimester: Secondary | ICD-10-CM | POA: Diagnosis not present

## 2020-09-08 DIAGNOSIS — Z3A01 Less than 8 weeks gestation of pregnancy: Secondary | ICD-10-CM

## 2020-09-08 DIAGNOSIS — O2391 Unspecified genitourinary tract infection in pregnancy, first trimester: Secondary | ICD-10-CM | POA: Diagnosis not present

## 2020-09-08 DIAGNOSIS — O26851 Spotting complicating pregnancy, first trimester: Secondary | ICD-10-CM | POA: Diagnosis not present

## 2020-09-08 LAB — TYPE AND SCREEN
ABO/RH(D): O POS
Antibody Screen: NEGATIVE

## 2020-09-08 LAB — CBC WITH DIFFERENTIAL/PLATELET
Abs Immature Granulocytes: 0.02 10*3/uL (ref 0.00–0.07)
Basophils Absolute: 0 10*3/uL (ref 0.0–0.1)
Basophils Relative: 0 %
Eosinophils Absolute: 0 10*3/uL (ref 0.0–0.5)
Eosinophils Relative: 1 %
HCT: 31.1 % — ABNORMAL LOW (ref 36.0–46.0)
Hemoglobin: 8.8 g/dL — ABNORMAL LOW (ref 12.0–15.0)
Immature Granulocytes: 0 %
Lymphocytes Relative: 17 %
Lymphs Abs: 1 10*3/uL (ref 0.7–4.0)
MCH: 19.8 pg — ABNORMAL LOW (ref 26.0–34.0)
MCHC: 28.3 g/dL — ABNORMAL LOW (ref 30.0–36.0)
MCV: 69.9 fL — ABNORMAL LOW (ref 80.0–100.0)
Monocytes Absolute: 0.5 10*3/uL (ref 0.1–1.0)
Monocytes Relative: 9 %
Neutro Abs: 4.1 10*3/uL (ref 1.7–7.7)
Neutrophils Relative %: 73 %
Platelets: 299 10*3/uL (ref 150–400)
RBC: 4.45 MIL/uL (ref 3.87–5.11)
RDW: 17.4 % — ABNORMAL HIGH (ref 11.5–15.5)
WBC: 5.7 10*3/uL (ref 4.0–10.5)
nRBC: 0 % (ref 0.0–0.2)

## 2020-09-08 LAB — HCG, QUANTITATIVE, PREGNANCY: hCG, Beta Chain, Quant, S: 42831 m[IU]/mL — ABNORMAL HIGH (ref <5)

## 2020-09-08 LAB — URINALYSIS, ROUTINE W REFLEX MICROSCOPIC
Bilirubin Urine: NEGATIVE
Glucose, UA: NEGATIVE mg/dL
Hgb urine dipstick: NEGATIVE
Ketones, ur: NEGATIVE mg/dL
Leukocytes,Ua: NEGATIVE
Nitrite: NEGATIVE
Protein, ur: NEGATIVE mg/dL
Specific Gravity, Urine: 1.015 (ref 1.005–1.030)
pH: 6 (ref 5.0–8.0)

## 2020-09-08 LAB — I-STAT BETA HCG BLOOD, ED (MC, WL, AP ONLY): I-stat hCG, quantitative: >2000 m[IU]/mL — ABNORMAL HIGH (ref <5)

## 2020-09-08 LAB — COMPREHENSIVE METABOLIC PANEL
ALT: 12 U/L (ref 0–44)
AST: 13 U/L — ABNORMAL LOW (ref 15–41)
Albumin: 3.7 g/dL (ref 3.5–5.0)
Alkaline Phosphatase: 60 U/L (ref 38–126)
Anion gap: 7 (ref 5–15)
BUN: 8 mg/dL (ref 6–20)
CO2: 23 mmol/L (ref 22–32)
Calcium: 8.5 mg/dL — ABNORMAL LOW (ref 8.9–10.3)
Chloride: 105 mmol/L (ref 98–111)
Creatinine, Ser: 0.7 mg/dL (ref 0.44–1.00)
GFR, Estimated: >60 mL/min (ref >60)
Glucose, Bld: 108 mg/dL — ABNORMAL HIGH (ref 70–99)
Potassium: 3.3 mmol/L — ABNORMAL LOW (ref 3.5–5.1)
Sodium: 135 mmol/L (ref 135–145)
Total Bilirubin: 0.7 mg/dL (ref 0.3–1.2)
Total Protein: 7.4 g/dL (ref 6.5–8.1)

## 2020-09-08 MED ORDER — ACETAMINOPHEN 325 MG PO TABS
650.0000 mg | ORAL_TABLET | Freq: Once | ORAL | Status: AC
  Administered 2020-09-08: 650 mg via ORAL
  Filled 2020-09-08: qty 2

## 2020-09-08 NOTE — ED Provider Notes (Signed)
Care of the patient assumed at the change of shift pending UA.   4:03 PM No signs of UTI, patient will be following with [facility] for her pregnancy. Advised to RTED for any other concerns.    Pollyann Savoy, MD 09/08/20 [phone]

## 2020-09-08 NOTE — ED Triage Notes (Signed)
Pt c/o abdominal pain since Friday and is [redacted] weeks pregnant.  Pt did have some minor spotting Friday for a short time but pain persists.

## 2020-09-08 NOTE — ED Notes (Signed)
Pt verbalized hard small stools lately with lots of blood in it. Pt is taking prenatal vitamins with high folic acid.

## 2020-09-08 NOTE — ED Notes (Signed)
Gown and sheet provided

## 2020-09-08 NOTE — ED Provider Notes (Signed)
[facility] EMERGENCY DEPARTMENT Provider Note   CSN: [id number] Arrival date & time: 09/08/20  1219     History Chief Complaint  Patient presents with  . Abdominal Pain    Julia Padilla is a 45 y.o. female.  HPI   44 year old G6 P2 female who is approximately [redacted] weeks pregnant presents the emergency department with lower abdominal pain.  Patient states that this started last evening and has progressed through to today.  She describes it as primarily right lower/pelvic, it is constant, does not radiate.  She denies any dysuria, hematuria, vaginal bleeding or vaginal discharge.  She states about a week ago she did have vaginal spotting but this resolved.  She denies any nausea/vomiting/diarrhea.  No fever.  The pregnancy thus far has been uncomplicated, she has had prior C-sections.  Past Medical History:  Diagnosis Date  . Anemia, iron deficiency 07/09/2016  . Bacterial vaginosis   . No pertinent past medical history   . Ovarian cyst   . UTI (lower urinary tract infection)     Patient Active Problem List   Diagnosis Date Noted  . Abnormal genetic test during pregnancy 09/05/2018  . 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 Asymptomatic bacteriuria during pregnancy in first trimester 08/01/2018  . AMA (advanced maternal age) multigravida 35+ 07/25/2018  . Vaginal cyst 07/25/2018  . Pregnancy with history of cesarean section, antepartum 01/17/2018  . Anemia, iron deficiency 07/09/2016    Past Surgical History:  Procedure Laterality Date  . CESAREAN SECTION     2000 and 2010  . DILATION AND CURETTAGE OF UTERUS       OB History    Gravida  6   Para  2   Term  2   Preterm  0   AB  2   Living  2     SAB  1   IAB  0   Ectopic  0   Multiple  0   Live Births  2           Family History  Problem Relation Age of Onset  . Thyroid disease Maternal Aunt   . Thyroid disease Maternal Grandmother   . Stroke Maternal Grandmother   . Heart disease  Maternal Grandmother   . Hyperlipidemia Maternal Grandmother   . Hypertension Maternal Grandmother   . Cancer Maternal Grandfather        skin  . Anesthesia problems Neg Hx     Social History   Tobacco Use  . Smoking status: Never Smoker  . Smokeless tobacco: Never Used  Vaping Use  . Vaping Use: Never used  Substance Use Topics  . Alcohol use: No  . Drug use: No    Home Medications Prior to Admission medications   Medication Sig Start Date End Date Taking? Authorizing Provider  acetaminophen (TYLENOL) 500 MG tablet Take 1,000 mg by mouth every 6 (six) hours as needed for mild pain.   Yes [provider]  Prenatal Vit-Fe Fumarate-FA (PRENATAL MULTIVITAMIN) TABS tablet Take 1 tablet by mouth daily at 12 noon.   Yes [provider]  progesterone (PROMETRIUM) 200 MG capsule Place 1 capsule (200 mg total) vaginally at bedtime. 09/03/20  Yes Cheral Marker, CNM    Allergies    Patient has no known allergies.  Review of Systems   Review of Systems  Constitutional: Negative for chills and fever.  HENT: Negative for congestion.   Eyes: Negative for visual disturbance.  Respiratory: Negative for shortness of  breath.   Cardiovascular: Negative for chest pain.  Gastrointestinal: Positive for abdominal pain. Negative for diarrhea and vomiting.  Genitourinary: Positive for pelvic pain and vaginal bleeding. Negative for difficulty urinating, dysuria, flank pain, frequency, hematuria, vaginal discharge and vaginal pain.  Skin: Negative for rash.  Neurological: Negative for headaches.    Physical Exam Updated Vital Signs BP (!) 141/70 (BP Location: Right Arm)   Pulse 98   Temp 98.8 F (37.1 C) (Oral)   Resp 18   Ht 5' 6.5" (1.689 m)   Wt 97.5 kg   LMP 07/14/2020 (Exact Date)   SpO2 100%   BMI 34.18 kg/m   Physical Exam Vitals and nursing note reviewed.  Constitutional:      Appearance: Normal appearance.  HENT:     Head: Normocephalic.      Mouth/Throat:     Mouth: Mucous membranes are moist.  Cardiovascular:     Rate and Rhythm: Normal rate.  Pulmonary:     Effort: Pulmonary effort is normal. No respiratory distress.  Abdominal:     Palpations: Abdomen is soft.     Tenderness: There is no abdominal tenderness. There is no guarding or rebound.  Skin:    General: Skin is warm.  Neurological:     Mental Status: She is alert and oriented to person, place, and time. Mental status is at baseline.  Psychiatric:        Mood and Affect: Mood normal.     ED Results / Procedures / Treatments   Labs (all labs ordered are listed, but only abnormal results are displayed) Labs Reviewed  CBC WITH DIFFERENTIAL/PLATELET  COMPREHENSIVE METABOLIC PANEL  HCG, QUANTITATIVE, PREGNANCY  TYPE AND SCREEN    EKG None  Radiology No results found.  Procedures Procedures   Medications Ordered in ED Medications  acetaminophen (TYLENOL) tablet 650 mg (has no administration in time range)    ED Course  I have reviewed the triage vital signs and the nursing notes.  Pertinent labs & imaging results that were available during my care of the patient were reviewed by me and considered in my medical decision making (see chart for details).    MDM Rules/Calculators/A&P                          45 year old female presents the emergency department with lower abdominal pain in the setting of pregnancy.  No ongoing vaginal bleeding or discharge.  She is slightly hypertensive to 141/70 but this has been baseline for the patient.  Vitals otherwise normal.  Abdomen is benign.  We will do lab evaluation, main concern rule out ectopic.  Ultrasound has been called in.  Ultrasound shows live intrauterine gestation, no significant ovarian abnormalities.  Blood work shows a baseline anemia without any other acute changes, hCG is 42,000, patient is O+.  She had spotting a couple days ago but no ongoing bleeding, no indication for pelvic, she does not  need Rh.  The last thing that we will check is a urinalysis to check for bacteria or pyuria.  I do anticipate discharge home, she is otherwise well-appearing and happy to go home.  She has a follow-up appointment with her OB in 2 days.  Patient signed out to oncoming provider pending urinalysis.  Final Clinical Impression(s) / ED Diagnoses Final diagnoses:  None    Rx / DC Orders ED Discharge Orders    None       Chairty Toman, Clabe Seal, DO  09/08/20 1510  

## 2020-09-09 ENCOUNTER — Other Ambulatory Visit: Payer: Self-pay | Admitting: Obstetrics & Gynecology

## 2020-09-09 DIAGNOSIS — O3680X0 Pregnancy with inconclusive fetal viability, not applicable or unspecified: Secondary | ICD-10-CM

## 2020-09-10 ENCOUNTER — Other Ambulatory Visit: Payer: Medicaid Other

## 2020-09-12 ENCOUNTER — Ambulatory Visit: Payer: Medicaid Other | Admitting: Nurse Practitioner

## 2020-09-25 ENCOUNTER — Telehealth: Payer: Self-pay | Admitting: *Deleted

## 2020-09-25 NOTE — Telephone Encounter (Signed)
Patient states she has noticed light pink when wiping after using the bathroom for the last couple of days.  Denies recent intercourse, cramping, etc.  Advised to continue to monitor and if she developed cramping, started passing clots, to let us know or go to the hospital.  Pt verbalized understanding and agreeable to do so.

## 2020-09-27 ENCOUNTER — Other Ambulatory Visit: Payer: Self-pay

## 2020-09-27 ENCOUNTER — Emergency Department (HOSPITAL_COMMUNITY)
Admission: EM | Admit: 2020-09-27 | Discharge: 2020-09-28 | Disposition: A | Discharge status: Home / Self Care | Payer: Medicaid Other | Attending: Emergency Medicine | Admitting: Emergency Medicine

## 2020-09-27 DIAGNOSIS — Z3A08 8 weeks gestation of pregnancy: Secondary | ICD-10-CM | POA: Diagnosis not present

## 2020-09-27 DIAGNOSIS — Z3A01 Less than 8 weeks gestation of pregnancy: Secondary | ICD-10-CM | POA: Diagnosis not present

## 2020-09-27 DIAGNOSIS — O034 Incomplete spontaneous abortion without complication: Secondary | ICD-10-CM | POA: Diagnosis not present

## 2020-09-27 DIAGNOSIS — R8271 Bacteriuria: Secondary | ICD-10-CM | POA: Diagnosis not present

## 2020-09-27 LAB — CBC WITH DIFFERENTIAL/PLATELET
Abs Immature Granulocytes: 0.02 10*3/uL (ref 0.00–0.07)
Basophils Absolute: 0 10*3/uL (ref 0.0–0.1)
Basophils Relative: 0 %
Eosinophils Absolute: 0.1 10*3/uL (ref 0.0–0.5)
Eosinophils Relative: 1 %
HCT: 28.8 % — ABNORMAL LOW (ref 36.0–46.0)
Hemoglobin: 8.1 g/dL — ABNORMAL LOW (ref 12.0–15.0)
Immature Granulocytes: 0 %
Lymphocytes Relative: 19 %
Lymphs Abs: 1.2 10*3/uL (ref 0.7–4.0)
MCH: 19.7 pg — ABNORMAL LOW (ref 26.0–34.0)
MCHC: 28.1 g/dL — ABNORMAL LOW (ref 30.0–36.0)
MCV: 70.1 fL — ABNORMAL LOW (ref 80.0–100.0)
Monocytes Absolute: 0.4 10*3/uL (ref 0.1–1.0)
Monocytes Relative: 7 %
Neutro Abs: 4.6 10*3/uL (ref 1.7–7.7)
Neutrophils Relative %: 73 %
Platelets: 310 10*3/uL (ref 150–400)
RBC: 4.11 MIL/uL (ref 3.87–5.11)
RDW: 18.1 % — ABNORMAL HIGH (ref 11.5–15.5)
WBC: 6.4 10*3/uL (ref 4.0–10.5)
nRBC: 0 % (ref 0.0–0.2)

## 2020-09-27 LAB — COMPREHENSIVE METABOLIC PANEL
ALT: 15 U/L (ref 0–44)
AST: 14 U/L — ABNORMAL LOW (ref 15–41)
Albumin: 3.7 g/dL (ref 3.5–5.0)
Alkaline Phosphatase: 61 U/L (ref 38–126)
Anion gap: 7 (ref 5–15)
BUN: 10 mg/dL (ref 6–20)
CO2: 22 mmol/L (ref 22–32)
Calcium: 8.6 mg/dL — ABNORMAL LOW (ref 8.9–10.3)
Chloride: 107 mmol/L (ref 98–111)
Creatinine, Ser: 0.62 mg/dL (ref 0.44–1.00)
GFR, Estimated: >60 mL/min (ref >60)
Glucose, Bld: 113 mg/dL — ABNORMAL HIGH (ref 70–99)
Potassium: 3.6 mmol/L (ref 3.5–5.1)
Sodium: 136 mmol/L (ref 135–145)
Total Bilirubin: 0.4 mg/dL (ref 0.3–1.2)
Total Protein: 7.3 g/dL (ref 6.5–8.1)

## 2020-09-27 LAB — ABO/RH: ABO/RH(D): O POS

## 2020-09-27 NOTE — ED Triage Notes (Addendum)
Pt states that she believes she's had a miscarriage because she has been passing a lot of clots since 2117 tonight.  Pt still having some vaginal bleeding/cramping. But has not soaked through more than 1 pad per hour.  Pt is approx. 8-[redacted] weeks pregnant.

## 2020-09-28 LAB — HCG, QUANTITATIVE, PREGNANCY: hCG, Beta Chain, Quant, S: 16524 m[IU]/mL — ABNORMAL HIGH (ref <5)

## 2020-09-28 MED ORDER — OXYCODONE-ACETAMINOPHEN 5-325 MG PO TABS
2.0000 | ORAL_TABLET | Freq: Once | ORAL | Status: AC
  Administered 2020-09-28: 2 via ORAL
  Filled 2020-09-28: qty 2

## 2020-09-28 MED ORDER — OXYCODONE-ACETAMINOPHEN 5-325 MG PO TABS: 2.0000 | ORAL_TABLET | ORAL | 0 refills | Status: DC | PRN

## 2020-09-28 NOTE — ED Provider Notes (Signed)
[facility] EMERGENCY DEPARTMENT Provider Note   CSN: [id number] Arrival date & time: 09/27/20  2229     History Chief Complaint  Patient presents with  . Threatened Miscarriage  . Vaginal Bleeding    Julia Padilla is a 45 y.o. female.   Vaginal Bleeding Quality:  Dark red and clots Severity:  Mild Onset quality:  Gradual Duration:  3 days Timing:  Constant Progression:  Worsening Chronicity:  New Menstrual history:  Irregular Number of pads used:  2 Possible pregnancy: yes   Relieved by:  Nothing Worsened by:  Nothing Ineffective treatments:  None tried Associated symptoms: no abdominal pain, no dizziness, no dysuria, no fatigue, no fever, no nausea and no vaginal discharge        Past Medical History:  Diagnosis Date  . Anemia, iron deficiency 07/09/2016  . Bacterial vaginosis   . No pertinent past medical history   . Ovarian cyst   . UTI (lower urinary tract infection)     Patient Active Problem List   Diagnosis Date Noted  . Abnormal genetic test during pregnancy 09/05/2018  . 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 Asymptomatic bacteriuria during pregnancy in first trimester 08/01/2018  . AMA (advanced maternal age) multigravida 35+ 07/25/2018  . Vaginal cyst 07/25/2018  . Pregnancy with history of cesarean section, antepartum 01/17/2018  . Anemia, iron deficiency 07/09/2016    Past Surgical History:  Procedure Laterality Date  . CESAREAN SECTION     2000 and 2010  . DILATION AND CURETTAGE OF UTERUS       OB History    Gravida  6   Para  2   Term  2   Preterm  0   AB  2   Living  2     SAB  1   IAB  0   Ectopic  0   Multiple  0   Live Births  2           Family History  Problem Relation Age of Onset  . Thyroid disease Maternal Aunt   . Thyroid disease Maternal Grandmother   . Stroke Maternal Grandmother   . Heart disease Maternal Grandmother   . Hyperlipidemia Maternal Grandmother   . Hypertension Maternal  Grandmother   . Cancer Maternal Grandfather        skin  . Anesthesia problems Neg Hx     Social History   Tobacco Use  . Smoking status: Never Smoker  . Smokeless tobacco: Never Used  Vaping Use  . Vaping Use: Never used  Substance Use Topics  . Alcohol use: No  . Drug use: No    Home Medications Prior to Admission medications   Medication Sig Start Date End Date Taking? Authorizing Provider  oxyCODONE-acetaminophen (PERCOCET) 5-325 MG tablet Take 2 tablets by mouth every 4 (four) hours as needed. 09/28/20  Yes Knight Oelkers, Barbara Cower, MD  acetaminophen (TYLENOL) 500 MG tablet Take 1,000 mg by mouth every 6 (six) hours as needed for mild pain.    [provider]  Prenatal Vit-Fe Fumarate-FA (PRENATAL MULTIVITAMIN) TABS tablet Take 1 tablet by mouth daily at 12 noon.    [provider]  progesterone (PROMETRIUM) 200 MG capsule Place 1 capsule (200 mg total) vaginally at bedtime. 09/03/20   Cheral Marker, CNM    Allergies    Patient has no known allergies.  Review of Systems   Review of Systems  Constitutional: Negative for fatigue and fever.  Gastrointestinal: Negative for abdominal  pain and nausea.  Genitourinary: Positive for vaginal bleeding. Negative for dysuria and vaginal discharge.  Neurological: Negative for dizziness.  All other systems reviewed and are negative.   Physical Exam Updated Vital Signs BP 131/81 (BP Location: Right Arm)   Pulse 78   Temp 97.6 F (36.4 C) (Oral)   Resp 17   Ht 5\' 7"  (1.702 m)   Wt 97.5 kg   LMP 07/14/2020 (Exact Date)   SpO2 100%   BMI 33.67 kg/m   Physical Exam Vitals and nursing note reviewed.  Constitutional:      Appearance: She is well-developed.  HENT:     Head: Normocephalic and atraumatic.     Mouth/Throat:     Mouth: Mucous membranes are moist.     Pharynx: Oropharynx is clear.  Eyes:     Pupils: Pupils are equal, round, and reactive to light.  Cardiovascular:     Rate and Rhythm: Normal rate and  regular rhythm.  Pulmonary:     Effort: No respiratory distress.     Breath sounds: No stridor.  Abdominal:     General: Abdomen is flat. There is no distension.  Musculoskeletal:        General: No swelling or tenderness. Normal range of motion.     Cervical back: Normal range of motion.  Skin:    General: Skin is warm and dry.  Neurological:     General: No focal deficit present.     Mental Status: She is alert.     ED Results / Procedures / Treatments   Labs (all labs ordered are listed, but only abnormal results are displayed) Labs Reviewed  CBC WITH DIFFERENTIAL/PLATELET - Abnormal; Notable for the following components:      Result Value   Hemoglobin 8.1 (*)    HCT 28.8 (*)    MCV 70.1 (*)    MCH 19.7 (*)    MCHC 28.1 (*)    RDW 18.1 (*)    All other components within normal limits  COMPREHENSIVE METABOLIC PANEL - Abnormal; Notable for the following components:   Glucose, Bld 113 (*)    Calcium 8.6 (*)    AST 14 (*)    All other components within normal limits  HCG, QUANTITATIVE, PREGNANCY - Abnormal; Notable for the following components:   hCG, Beta Chain, Quant, S 16,524 (*)    All other components within normal limits  ABO/RH    EKG None  Radiology No results found.  Procedures Procedures   Medications Ordered in ED Medications  oxyCODONE-acetaminophen (PERCOCET/ROXICET) 5-325 MG per tablet 2 tablet (2 tablets Oral Given 09/28/20 0022)    ED Course  I have reviewed the triage vital signs and the nursing notes.  Pertinent labs & imaging results that were available during my care of the patient were reviewed by me and considered in my medical decision making (see chart for details).    MDM Rules/Calculators/A&P                          Likely miscarriage. Not able to visualize IUP. HCG downtrending from a few weeks ago. No symptomatic anemia. No indication for further workup at this time.   Final Clinical Impression(s) / ED Diagnoses Final  diagnoses:  Incomplete miscarriage    Rx / DC Orders ED Discharge Orders         Ordered    oxyCODONE-acetaminophen (PERCOCET) 5-325 MG tablet  Every 4 hours PRN  09/28/20 0030           Maniah Nading, Barbara Cower, MD 09/28/20 9842

## 2020-09-29 MED FILL — Oxycodone w/ Acetaminophen Tab 5-325 MG: ORAL | Qty: 6 | Status: AC

## 2020-09-30 ENCOUNTER — Ambulatory Visit: Payer: Medicaid Other | Admitting: *Deleted

## 2020-09-30 ENCOUNTER — Encounter: Payer: Medicaid Other | Admitting: Women's Health

## 2020-10-01 ENCOUNTER — Other Ambulatory Visit: Payer: Self-pay

## 2020-10-01 ENCOUNTER — Ambulatory Visit (INDEPENDENT_AMBULATORY_CARE_PROVIDER_SITE_OTHER): Payer: Medicaid Other | Admitting: Adult Health

## 2020-10-01 ENCOUNTER — Encounter: Payer: Self-pay | Admitting: Adult Health

## 2020-10-01 VITALS — BP 136/89 | HR 79 | Ht 67.0 in | Wt 219.0 lb

## 2020-10-01 DIAGNOSIS — O039 Complete or unspecified spontaneous abortion without complication: Secondary | ICD-10-CM | POA: Insufficient documentation

## 2020-10-01 NOTE — Progress Notes (Signed)
  Subjective:     Patient ID: Julia Padilla, female   DOB: [birth year], 45 y.o.   MRN: [id number]  HPI Julia Padilla is a 45 year old white female,married, in for follow up on miscarriage was seen in ER 09/27/20 and [facility] was 16,524, down from 42,831 on 09/08/20. She is still bleeding some with cramps, taking tylenol. 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 PCP is Laury Axon NP.  Review of Systems +bleeding and Cramping +teary Reviewed past medical,surgical, social and family history. Reviewed medications and allergies.     Objective:   Physical Exam BP 136/89 (BP Location: Left Arm, Patient Position: Sitting, Cuff Size: Normal)   Pulse 79   Ht 5\' 7"  (1.702 m)   Wt 219 lb (99.3 kg)   LMP 07/14/2020 (Exact Date)   BMI 34.30 kg/m  Skin warm and dry. Lungs: clear to ausculation bilaterally. Cardiovascular: regular rate and rhythm.Abdomen is soft and mildly tender Blood type is 0+.   Upstream - 10/01/20 1047      Pregnancy Intention Screening   Does the patient want to become pregnant in the next year? Unsure    Does the patient's partner want to become pregnant in the next year? Yes    Would the patient like to discuss contraceptive options today? No      Contraception Wrap Up   Current Method Pregnant/Seeking Pregnancy    End Method Pregnant/Seeking Pregnancy    Contraception Counseling Provided No             Assessment:     1. Miscarriage Check QHCG  Take PNV    Plan:    Discussed added genetic testing  Follow up in 2 weeks

## 2020-10-02 LAB — BETA HCG QUANT (REF LAB): hCG Quant: 1628 m[IU]/mL

## 2020-10-14 ENCOUNTER — Other Ambulatory Visit: Payer: Medicaid Other

## 2020-10-14 ENCOUNTER — Ambulatory Visit: Payer: Medicaid Other | Admitting: *Deleted

## 2020-10-14 ENCOUNTER — Encounter: Payer: Medicaid Other | Admitting: Women's Health

## 2020-10-15 ENCOUNTER — Ambulatory Visit: Payer: Medicaid Other | Admitting: Adult Health

## 2020-10-30 ENCOUNTER — Other Ambulatory Visit: Payer: Self-pay

## 2020-10-30 ENCOUNTER — Ambulatory Visit (INDEPENDENT_AMBULATORY_CARE_PROVIDER_SITE_OTHER): Payer: Medicaid Other | Admitting: Adult Health

## 2020-10-30 ENCOUNTER — Encounter: Payer: Self-pay | Admitting: Adult Health

## 2020-10-30 VITALS — BP 147/86 | HR 75 | Ht 66.0 in | Wt 220.5 lb

## 2020-10-30 DIAGNOSIS — N96 Recurrent pregnancy loss: Secondary | ICD-10-CM | POA: Diagnosis not present

## 2020-10-30 DIAGNOSIS — Z319 Encounter for procreative management, unspecified: Secondary | ICD-10-CM | POA: Diagnosis not present

## 2020-10-30 DIAGNOSIS — O039 Complete or unspecified spontaneous abortion without complication: Secondary | ICD-10-CM

## 2020-10-30 NOTE — Progress Notes (Signed)
  Subjective:     Patient ID: Julia Padilla, female   DOB: May 10, 1976, 45 y.o.   MRN: [id number]  HPI Anna is a 45 year old white female, married, sp recent miscarriage, no bleeding. Wants to get pregnant again but worried since has had 3 miscarriages in a row. QHCG was 1628 on 10/01/20. PCP is Laury Axon NP.  Review of Systems No bleeding Reviewed past medical,surgical, social and family history. Reviewed medications and allergies.     Objective:   Physical Exam BP (!) 147/86 (BP Location: Left Arm, Patient Position: Sitting, Cuff Size: Large)   Pulse 75   Ht 5\' 6"  (1.676 m)   Wt 220 lb 8 oz (100 kg)   LMP 07/14/2020 (Exact Date)   Breastfeeding No   BMI 35.59 kg/m  Skin warm and dry. Lungs: clear to ausculation bilaterally. Cardiovascular: regular rate and rhythm.   Fall risk is low  Upstream - 10/30/20 1227      Pregnancy Intention Screening   Does the patient want to become pregnant in the next year? Yes    Does the patient's partner want to become pregnant in the next year? Yes    Would the patient like to discuss contraceptive options today? No      Contraception Wrap Up   Current Method Pregnant/Seeking Pregnancy    End Method Pregnant/Seeking Pregnancy           Assessment:     1. Miscarriage Will check QHCG to see if continues to drop - Beta hCG quant (ref lab)  2. History of multiple miscarriages Discussed with Dr 12/30/20 will check AMH  - Anti-Mullerian Hormone [facility]), Female  3. Patient desires pregnancy May need to see MD  - Anti-Mullerian Hormone [facility]), Female    Plan:     Will talk when labs back Follow up prn

## 2020-11-28 ENCOUNTER — Other Ambulatory Visit: Payer: Self-pay | Admitting: Adult Health

## 2020-11-28 MED ORDER — CEPHALEXIN 500 MG PO CAPS: 500.0000 mg | ORAL_CAPSULE | Freq: Three times a day (TID) | ORAL | 0 refills | Status: DC

## 2020-11-28 NOTE — Progress Notes (Signed)
rx keflex

## 2020-12-12 ENCOUNTER — Ambulatory Visit
Admission: EM | Admit: 2020-12-12 | Discharge: 2020-12-12 | Discharge status: Against Medical Advice | Payer: Medicaid Other

## 2020-12-12 ENCOUNTER — Ambulatory Visit
Admission: EM | Admit: 2020-12-12 | Discharge: 2020-12-12 | Disposition: A | Discharge status: Home / Self Care | Payer: Medicaid Other | Attending: Emergency Medicine | Admitting: Emergency Medicine

## 2020-12-12 ENCOUNTER — Other Ambulatory Visit: Payer: Self-pay

## 2020-12-12 VITALS — BP 129/84 | HR 89 | Temp 98.6°F | Resp 18

## 2020-12-12 DIAGNOSIS — K05219 Aggressive periodontitis, localized, unspecified severity: Secondary | ICD-10-CM | POA: Diagnosis not present

## 2020-12-12 MED ORDER — DEXAMETHASONE SODIUM PHOSPHATE 10 MG/ML IJ SOLN
10.0000 mg | Freq: Once | INTRAMUSCULAR | Status: AC
  Administered 2020-12-12: 10 mg via INTRAMUSCULAR

## 2020-12-12 MED ORDER — AMOXICILLIN-POT CLAVULANATE 875-125 MG PO TABS: 1.0000 | ORAL_TABLET | Freq: Two times a day (BID) | ORAL | 0 refills | Status: AC

## 2020-12-12 MED ORDER — IBUPROFEN 800 MG PO TABS: 800.0000 mg | ORAL_TABLET | Freq: Three times a day (TID) | ORAL | 0 refills | Status: DC

## 2020-12-12 MED ORDER — PREDNISONE 20 MG PO TABS: 20.0000 mg | ORAL_TABLET | Freq: Two times a day (BID) | ORAL | 0 refills | Status: AC

## 2020-12-12 NOTE — Discharge Instructions (Addendum)
Steroid shot and prednisone for swelling Ibuprofen 800 mg for pain Augmentin for infection Recommend soft diet until evaluated by dentist Maintain oral hygiene care Follow up with dentist as needed Return or go to the ED if you have any new or worsening symptoms such as fever, chills, difficulty swallowing, painful swallowing, oral or neck swelling, nausea, vomiting, chest pain, SOB, etc..Marland Kitchen

## 2020-12-12 NOTE — ED Provider Notes (Signed)
[facility] CARE CENTER   [id number] 12/12/20 Arrival Time: 1726  CC: Gum PAIN  SUBJECTIVE:  Julia Padilla is a 44 y.o. female who presents with LT upper gum pain x 2 days.  Denies a precipitating event or trauma.  Localizes pain to LT upper side.  Has tried OTC analgesics without relief.  Worse with chewing.  Does not see a dentist regularly.  Reports similar symptoms in the past.  Denies fever, chills, dysphagia, odynophagia, oral or neck swelling, nausea, vomiting, chest pain, SOB.    ROS: As per HPI.  All other pertinent ROS negative.     Past Medical History:  Diagnosis Date   Anemia, iron deficiency 07/09/2016   Bacterial vaginosis    Miscarriage    No pertinent past medical history    Ovarian cyst    UTI (lower urinary tract infection)    Past Surgical History:  Procedure Laterality Date   CESAREAN SECTION     2000 and 2010   DILATION AND CURETTAGE OF UTERUS     No Known Allergies No current facility-administered medications on file prior to encounter.   Current Outpatient Medications on File Prior to Encounter  Medication Sig Dispense Refill   acetaminophen (TYLENOL) 500 MG tablet Take 1,000 mg by mouth every 6 (six) hours as needed for mild pain.     Cyanocobalamin (VITAMIN B 12 PO) Take by mouth.     Prenatal Vit-Fe Fumarate-FA (PRENATAL MULTIVITAMIN) TABS tablet Take 1 tablet by mouth daily at 12 noon.     Social History   Socioeconomic History   Marital status: Married    Spouse name: engaged -Insurance risk surveyor of children: 2   Years of education: 12   Highest education level: Not on file  Occupational History   Occupation: Airline pilot    Comment: [employer]  Tobacco Use   Smoking status: Never   Smokeless tobacco: Never  Vaping Use   Vaping Use: Never used  Substance and Sexual Activity   Alcohol use: No   Drug use: No   Sexual activity: Not Currently    Birth control/protection: None  Other Topics Concern   Not on file  Social History Narrative    Divorced   Two children   Engaged to [employer] Geographical information systems officer)   Social Determinants of Corporate investment banker Strain: Not on file  Food Insecurity: Not on file  Transportation Needs: Not on file  Physical Activity: Not on file  Stress: Not on file  Social Connections: Not on file  Intimate Partner Violence: Not on file   Family History  Problem Relation Age of Onset   Thyroid disease Maternal Aunt    Thyroid disease Maternal Grandmother    Stroke Maternal Grandmother    Heart disease Maternal Grandmother    Hyperlipidemia Maternal Grandmother    Hypertension Maternal Grandmother    Cancer Maternal Grandfather        skin   Anesthesia problems Neg Hx     OBJECTIVE:  Vitals:   12/12/20 1754  BP: 129/84  Pulse: 89  Resp: 18  Temp: 98.6 F (37 C)  SpO2: 99%    General appearance: alert; no distress HENT: normocephalic; atraumatic; dentition: fair; gum abscess present over LT upper gum, TTP, no obvious drainage or bleeding Neck: supple without LAD Lungs: normal respirations Skin: warm and dry Psychological: alert and cooperative; normal mood and affect  ASSESSMENT & PLAN:  1. Gum abscess     Meds ordered this encounter  Medications  amoxicillin-clavulanate (AUGMENTIN) 875-125 MG tablet    Sig: Take 1 tablet by mouth every 12 (twelve) hours for 10 days.    Dispense:  20 tablet    Refill:  0    Order Specific Question:   Supervising Provider    Answer:   Eustace Moore [[id number]]   predniSONE (DELTASONE) 20 MG tablet    Sig: Take 1 tablet (20 mg total) by mouth 2 (two) times daily with a meal for 5 days.    Dispense:  10 tablet    Refill:  0    Order Specific Question:   Supervising Provider    Answer:   Eustace Moore [[id number]]   ibuprofen (ADVIL) 800 MG tablet    Sig: Take 1 tablet (800 mg total) by mouth 3 (three) times daily.    Dispense:  21 tablet    Refill:  0    Order Specific Question:   Supervising Provider    Answer:   Eustace Moore  [[id number]]   dexamethasone (DECADRON) injection 10 mg   Steroid shot and prednisone for swelling Ibuprofen 800 mg for pain Augmentin for infection Recommend soft diet until evaluated by dentist Maintain oral hygiene care Follow up with dentist as needed Return or go to the ED if you have any new or worsening symptoms such as fever, chills, difficulty swallowing, painful swallowing, oral or neck swelling, nausea, vomiting, chest pain, SOB, etc...  Reviewed expectations re: course of current medical issues. Questions answered. Outlined signs and symptoms indicating need for more acute intervention. Patient verbalized understanding. After Visit Summary given.    Rennis Harding, PA-C 12/12/20 1952

## 2020-12-12 NOTE — ED Triage Notes (Signed)
Pt presents with left side facial swelling and dental pain  that began on Tuesday

## 2020-12-18 MED ORDER — FLUCONAZOLE 200 MG PO TABS: 200.0000 mg | ORAL_TABLET | Freq: Every day | ORAL | 0 refills | Status: AC

## 2021-03-11 ENCOUNTER — Ambulatory Visit: Payer: Medicaid Other

## 2021-04-20 ENCOUNTER — Ambulatory Visit
Admission: EM | Admit: 2021-04-20 | Discharge: 2021-04-20 | Disposition: A | Discharge status: Home / Self Care | Payer: Medicaid Other | Attending: Family Medicine | Admitting: Family Medicine

## 2021-04-20 ENCOUNTER — Other Ambulatory Visit: Payer: Self-pay

## 2021-04-20 ENCOUNTER — Encounter: Payer: Self-pay | Admitting: Emergency Medicine

## 2021-04-20 DIAGNOSIS — N390 Urinary tract infection, site not specified: Secondary | ICD-10-CM | POA: Diagnosis not present

## 2021-04-20 DIAGNOSIS — N898 Other specified noninflammatory disorders of vagina: Secondary | ICD-10-CM

## 2021-04-20 DIAGNOSIS — R300 Dysuria: Secondary | ICD-10-CM | POA: Diagnosis not present

## 2021-04-20 LAB — POCT URINALYSIS DIP (MANUAL ENTRY)
Bilirubin, UA: NEGATIVE
Blood, UA: NEGATIVE
Glucose, UA: NEGATIVE mg/dL
Nitrite, UA: NEGATIVE
Protein Ur, POC: 30 mg/dL — AB
Spec Grav, UA: 1.02 (ref 1.010–1.025)
Urobilinogen, UA: 0.2 E.U./dL
pH, UA: 7 (ref 5.0–8.0)

## 2021-04-20 LAB — POCT URINE PREGNANCY: Preg Test, Ur: NEGATIVE

## 2021-04-20 MED ORDER — CEPHALEXIN 500 MG PO CAPS: 500.0000 mg | ORAL_CAPSULE | Freq: Two times a day (BID) | ORAL | 0 refills | Status: DC

## 2021-04-20 NOTE — ED Triage Notes (Signed)
Burning with urination and lower back for over a week.

## 2021-04-20 NOTE — ED Provider Notes (Signed)
[facility]    CSN: [id number] Arrival date & time: 04/20/21  1154      History   Chief Complaint Chief Complaint  Patient presents with   Urinary Tract Infection    HPI Julia Padilla is a 45 y.o. female.   Patient presenting today with about a week of dysuria, low back aching, mild nausea.  Denies fever, chills, vomiting, diarrhea or constipation, hematuria.  Does also have some vaginal irritation but no discharge, new sexual partners, concern for STDs.  So far taking Azo with mild temporary relief of symptoms.  LMP 03/19/2021.   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   Asymptomatic bacteriuria during pregnancy in first trimester 08/01/2018   AMA (advanced maternal age) multigravida 35+ 07/25/2018   Vaginal cyst 07/25/2018   Pregnancy with history of cesarean section, antepartum 01/17/2018   Anemia, iron deficiency 07/09/2016    Past Surgical History:  Procedure Laterality Date   CESAREAN SECTION     2000 and 2010   DILATION AND CURETTAGE OF UTERUS      OB History     Gravida  6   Para  2   Term  2   Preterm  0   AB  2   Living  2      SAB  1   IAB  0   Ectopic  0   Multiple  0   Live Births  2            Home Medications    Prior to Admission medications   Medication Sig Start Date End Date Taking? Authorizing Provider  cephALEXin (KEFLEX) 500 MG capsule Take 1 capsule (500 mg total) by mouth 2 (two) times daily. 04/20/21  Yes Particia Nearing, PA-C  acetaminophen (TYLENOL) 500 MG tablet Take 1,000 mg by mouth every 6 (six) hours  as needed for mild pain.    [provider]  Cyanocobalamin (VITAMIN B 12 PO) Take by mouth.    [provider]  ibuprofen (ADVIL) 800 MG tablet Take 1 tablet (800 mg total) by mouth 3 (three) times daily. 12/12/20   [place], [place], PA-C  Prenatal Vit-Fe Fumarate-FA (PRENATAL MULTIVITAMIN) TABS tablet Take 1 tablet by mouth daily at 12 noon.    [provider]    Family History Family History  Problem Relation Age of Onset   Thyroid disease Maternal Aunt    Thyroid disease Maternal Grandmother    Stroke Maternal Grandmother    Heart disease Maternal Grandmother    Hyperlipidemia Maternal Grandmother    Hypertension Maternal Grandmother    Cancer Maternal Grandfather        skin   Anesthesia problems Neg Hx     Social History Social History   Tobacco Use   Smoking status: Never   Smokeless tobacco: Never  Vaping Use   Vaping Use: Never used  Substance Use Topics   Alcohol use: No   Drug use: No     Allergies   Patient has no known allergies.   Review of Systems Review  of Systems Per HPI  Physical Exam Triage Vital Signs ED Triage Vitals [04/20/21 1211]  Enc Vitals Group     BP 128/82     Pulse Rate 86     Resp 18     Temp 98.4 F (36.9 C)     Temp Source Oral     SpO2 98 %     Weight 210 lb (95.3 kg)     Height      Head Circumference      Peak Flow      Pain Score 7     Pain Loc      Pain Edu?      Excl. in GC?    No data found.  Updated Vital Signs BP 128/82 (BP Location: Right Arm)   Pulse 86   Temp 98.4 F (36.9 C) (Oral)   Resp 18   Wt 210 lb (95.3 kg)   LMP 03/19/2021   SpO2 98%   BMI 33.89 kg/m   Visual Acuity Right Eye Distance:   Left Eye Distance:   Bilateral Distance:    Right Eye Near:   Left Eye Near:    Bilateral Near:     Physical Exam Vitals and nursing note reviewed.  Constitutional:      Appearance: Normal appearance. She is not ill-appearing.  HENT:     Head: Atraumatic.  Eyes:      Extraocular Movements: Extraocular movements intact.     Conjunctiva/sclera: Conjunctivae normal.  Cardiovascular:     Rate and Rhythm: Normal rate and regular rhythm.     Heart sounds: Normal heart sounds.  Pulmonary:     Effort: Pulmonary effort is normal.     Breath sounds: Normal breath sounds.  Abdominal:     General: Bowel sounds are normal. There is no distension.     Palpations: Abdomen is soft.     Tenderness: There is no abdominal tenderness. There is no right CVA tenderness, left CVA tenderness or guarding.  Genitourinary:    Comments: GU exam deferred, self swab performed Musculoskeletal:        General: Normal range of motion.     Cervical back: Normal range of motion and neck supple.  Skin:    General: Skin is warm and dry.  Neurological:     Mental Status: She is alert and oriented to person, place, and time.  Psychiatric:        Mood and Affect: Mood normal.        Thought Content: Thought content normal.        Judgment: Judgment normal.   UC Treatments / Results  Labs (all labs ordered are listed, but only abnormal results are displayed) Labs Reviewed  POCT URINALYSIS DIP (MANUAL ENTRY) - Abnormal; Notable for the following components:      Result Value   Ketones, POC UA trace (5) (*)    Protein Ur, POC =30 (*)    Leukocytes, UA Small (1+) (*)    All other components within normal limits  URINE CULTURE  POCT URINE PREGNANCY  CERVICOVAGINAL ANCILLARY ONLY    EKG   Radiology No results found.  Procedures Procedures (including critical care time)  Medications Ordered in UC Medications - No data to display  Initial Impression / Assessment and Plan / UC Course  I have reviewed the triage vital signs and the nursing notes.  Pertinent labs & imaging results that were available during my care of the patient were reviewed by me and considered  in my medical decision making (see chart for details).     Vitals and exam reassuring, UA showing possible  urinary tract infection.  Urine culture and vaginal swab pending given vaginal irritation additionally.  We will start Keflex while awaiting the remainder of the results.  Adjust as needed based on these.  May continue Azo as needed, discussed good fluid intake as well.  Final Clinical Impressions(s) / UC Diagnoses   Final diagnoses:  Dysuria  Vaginal irritation  Acute lower UTI   Discharge Instructions   None    ED Prescriptions     Medication Sig Dispense Auth. Provider   cephALEXin (KEFLEX) 500 MG capsule Take 1 capsule (500 mg total) by mouth 2 (two) times daily. 10 capsule Particia Nearing, [place]      PDMP not reviewed this encounter.   Particia Nearing, [place] 04/20/21 1237

## 2021-04-21 ENCOUNTER — Encounter: Payer: Self-pay | Admitting: Nurse Practitioner

## 2021-04-21 LAB — URINE CULTURE: Culture: <10000 — AB

## 2021-04-21 LAB — CERVICOVAGINAL ANCILLARY ONLY
Bacterial Vaginitis (gardnerella): POSITIVE — AB
Candida Glabrata: NEGATIVE
Candida Vaginitis: POSITIVE — AB
Chlamydia: NEGATIVE
Comment: NEGATIVE
Comment: NEGATIVE
Comment: NEGATIVE
Comment: NEGATIVE
Comment: NEGATIVE
Comment: NORMAL
Neisseria Gonorrhea: NEGATIVE
Trichomonas: NEGATIVE

## 2021-04-22 ENCOUNTER — Telehealth: Payer: Self-pay

## 2021-04-22 MED ORDER — METRONIDAZOLE 500 MG PO TABS: 500.0000 mg | ORAL_TABLET | Freq: Two times a day (BID) | ORAL | 0 refills | Status: DC

## 2021-04-22 MED ORDER — FLUCONAZOLE 150 MG PO TABS: 150.0000 mg | ORAL_TABLET | Freq: Every day | ORAL | 0 refills | Status: AC

## 2021-04-22 NOTE — Telephone Encounter (Signed)
Pt advised with verbal understanding  °

## 2021-04-29 ENCOUNTER — Telehealth: Payer: Self-pay | Admitting: Emergency Medicine

## 2021-04-29 NOTE — Telephone Encounter (Signed)
Pt contacted UC stating still having symptoms after completing both antibiotics. Pt advised will need to be seen again. APPT made for tomorrow at 12 pm.

## 2021-04-29 NOTE — Telephone Encounter (Signed)
Pt contacted UC  in regards to antibiotics. LVM returning call.

## 2021-04-30 ENCOUNTER — Ambulatory Visit: Payer: Medicaid Other

## 2021-04-30 ENCOUNTER — Ambulatory Visit
Admission: EM | Admit: 2021-04-30 | Discharge: 2021-04-30 | Disposition: A | Discharge status: Home / Self Care | Payer: Medicaid Other | Attending: Urgent Care | Admitting: Urgent Care

## 2021-04-30 DIAGNOSIS — R300 Dysuria: Secondary | ICD-10-CM

## 2021-04-30 DIAGNOSIS — R350 Frequency of micturition: Secondary | ICD-10-CM | POA: Diagnosis not present

## 2021-04-30 LAB — POCT URINALYSIS DIP (MANUAL ENTRY)
Bilirubin, UA: NEGATIVE
Glucose, UA: NEGATIVE mg/dL
Ketones, POC UA: NEGATIVE mg/dL
Nitrite, UA: NEGATIVE
Protein Ur, POC: 100 mg/dL — AB
Spec Grav, UA: 1.02 (ref 1.010–1.025)
Urobilinogen, UA: 0.2 E.U./dL
pH, UA: 6 (ref 5.0–8.0)

## 2021-04-30 NOTE — ED Triage Notes (Signed)
Pt last seen at [facility] on 10/23 tx for yeast infection with keflex and flagyl. Pt reports that her low back pain and dysuria have continued. Has tried AZO w/o relief. Pt believes that she may have a UTI. Denies hematuria. Confirms urinary urgency and frequency.

## 2021-04-30 NOTE — Discharge Instructions (Signed)
Make sure you hydrate very well with plain water and a quantity of 80-96 ounces of water a day.  Please limit drinks that are considered urinary irritants such as soda, sweet tea, coffee, energy drinks, alcohol.  These can worsen your urinary and genital symptoms but also be the source of them.  I will let you know about your urine culture results through MyChart to see if we need to prescribe or change your antibiotics based off of those results.  

## 2021-04-30 NOTE — ED Provider Notes (Signed)
[facility]   MRN: [id number] DOB: 1975/08/05  Subjective:   Julia Padilla is a 45 y.o. female presenting for recheck on persistent urinary frequency, urinary urgency and dysuria.  No fever, nausea, vomiting, abdominal or pelvic pain, vaginal discharge.  Was seen about 2 weeks ago, advised to stop an antibiotic as her urine culture was equivocal.  She did test positive for yeast vaginitis and bacterial vaginosis which was treated, she completed treatment and has since resolved.  She has not seen a urologist but admits that she has had frequent issues with urinary symptoms.  Prior to her current symptoms over the past 2 weeks she was drinking a lot of sodas daily, has not hydrated well with water.  She has tried to do better, drinks cranberry juice at night pretty consistently.  Drinks a small cup of coffee daily.  No current facility-administered medications for this encounter.  Current Outpatient Medications:    acetaminophen (TYLENOL) 500 MG tablet, Take 1,000 mg by mouth every 6 (six) hours as needed for mild pain., Disp: , Rfl:    cephALEXin (KEFLEX) 500 MG capsule, Take 1 capsule (500 mg total) by mouth 2 (two) times daily., Disp: 10 capsule, Rfl: 0   Cyanocobalamin (VITAMIN B 12 PO), Take by mouth., Disp: , Rfl:    ibuprofen (ADVIL) 800 MG tablet, Take 1 tablet (800 mg total) by mouth 3 (three) times daily., Disp: 21 tablet, Rfl: 0   metroNIDAZOLE (FLAGYL) 500 MG tablet, Take 1 tablet (500 mg total) by mouth 2 (two) times daily., Disp: 14 tablet, Rfl: 0   Prenatal Vit-Fe Fumarate-FA (PRENATAL MULTIVITAMIN) TABS tablet, Take 1 tablet by mouth daily at 12 noon., Disp: , Rfl:    No Known Allergies  Past Medical History:  Diagnosis Date   Anemia, iron deficiency 07/09/2016   Bacterial vaginosis    Miscarriage    No pertinent past medical history    Ovarian cyst    UTI (lower urinary tract infection)      Past Surgical History:  Procedure Laterality Date    CESAREAN SECTION     2000 and 2010   DILATION AND CURETTAGE OF UTERUS      Family History  Problem Relation Age of Onset   Thyroid disease Maternal Aunt    Thyroid disease Maternal Grandmother    Stroke Maternal Grandmother    Heart disease Maternal Grandmother    Hyperlipidemia Maternal Grandmother    Hypertension Maternal Grandmother    Cancer Maternal Grandfather        skin   Anesthesia problems Neg Hx     Social History   Tobacco Use   Smoking status: Never   Smokeless tobacco: Never  Vaping Use   Vaping Use: Never used  Substance Use Topics   Alcohol use: No   Drug use: No    ROS   Objective:   Vitals: BP (!) 141/88 (BP Location: Right Arm)   Pulse 77   Temp 98.1 F (36.7 C) (Oral)   Resp 18   SpO2 100%   Physical Exam Constitutional:      General: She is not in acute distress.    Appearance: Normal appearance. She is well-developed. She is obese. She is not ill-appearing, toxic-appearing or diaphoretic.  HENT:     Head: Normocephalic and atraumatic.     Nose: Nose normal.     Mouth/Throat:     Mouth: Mucous membranes are moist.     Pharynx: Oropharynx is clear.  Eyes:  General: No scleral icterus.       Right eye: No discharge.        Left eye: No discharge.     Extraocular Movements: Extraocular movements intact.     Conjunctiva/sclera: Conjunctivae normal.     Pupils: Pupils are equal, round, and reactive to light.  Cardiovascular:     Rate and Rhythm: Normal rate.  Pulmonary:     Effort: Pulmonary effort is normal.  Abdominal:     Tenderness: There is no right CVA tenderness or left CVA tenderness.  Skin:    General: Skin is warm and dry.  Neurological:     General: No focal deficit present.     Mental Status: She is alert and oriented to person, place, and time.  Psychiatric:        Mood and Affect: Mood normal.        Behavior: Behavior normal.        Thought Content: Thought content normal.        Judgment: Judgment normal.     Results for orders placed or performed during the hospital encounter of 04/30/21 (from the past 24 hour(s))  POCT urinalysis dipstick     Status: Abnormal   Collection Time: 04/30/21  1:03 PM  Result Value Ref Range   Color, UA red (A) yellow   Clarity, UA clear clear   Glucose, UA negative negative mg/dL   Bilirubin, UA negative negative   Ketones, POC UA negative negative mg/dL   Spec Grav, UA 7.169 6.789 - 1.025   Blood, UA large (A) negative   pH, UA 6.0 5.0 - 8.0   Protein Ur, POC =100 (A) negative mg/dL   Urobilinogen, UA 0.2 0.2 or 1.0 E.U./dL   Nitrite, UA Negative Negative   Leukocytes, UA Trace (A) Negative    Assessment and Plan :   PDMP not reviewed this encounter.  1. Urinary frequency   2. Dysuria    We will hold off on more antibiotics and based off of the urine culture result.  Recommended a consultation with a urologist that she has had persistent issues with this urinary symptoms.  Recommended hydrating with plain water, avoidance of urinary irritants.  Patient declined repeat testing for the cervical swab.  Counseled patient on potential for adverse effects with medications prescribed/recommended today, ER and return-to-clinic precautions discussed, patient verbalized understanding.    Wallis Bamberg, PA-C 04/30/21 1315

## 2021-05-01 LAB — URINE CULTURE: Culture: NO GROWTH

## 2021-05-13 ENCOUNTER — Telehealth: Payer: Self-pay

## 2021-05-13 NOTE — Telephone Encounter (Signed)
Patient called, never seen in our office.  The PCP was changed outside of our office.

## 2021-06-27 ENCOUNTER — Ambulatory Visit (HOSPITAL_COMMUNITY): Payer: Self-pay

## 2021-07-09 ENCOUNTER — Telehealth: Payer: Medicaid Other | Admitting: Nurse Practitioner

## 2021-07-09 DIAGNOSIS — B3731 Acute candidiasis of vulva and vagina: Secondary | ICD-10-CM

## 2021-07-09 MED ORDER — FLUCONAZOLE 150 MG PO TABS: 150.0000 mg | ORAL_TABLET | Freq: Once | ORAL | 0 refills | Status: AC

## 2021-07-09 MED ORDER — NYSTATIN 100000 UNIT/GM EX CREA: 1.0000 "application " | TOPICAL_CREAM | Freq: Two times a day (BID) | CUTANEOUS | 0 refills | Status: DC

## 2021-07-09 NOTE — Patient Instructions (Signed)
Vaginal Yeast Infection, Adult °Vaginal yeast infection is a condition that causes vaginal discharge as well as soreness, swelling, and redness (inflammation) of the vagina. This is a common condition. Some women get this infection frequently. °What are the causes? °This condition is caused by a change in the normal balance of the yeast (Candida) and normal bacteria that live in the vagina. This change causes an overgrowth of yeast, which causes the inflammation. °What increases the risk? °The condition is more likely to develop in women who: °Take antibiotic medicines. °Have diabetes. °Take birth control pills. °Are pregnant. °Douche often. °Have a weak body defense system (immune system). °Have been taking steroid medicines for a long time. °Frequently wear tight clothing. °What are the signs or symptoms? °Symptoms of this condition include: °White, thick, creamy vaginal discharge. °Swelling, itching, redness, and irritation of the vagina. The lips of the vagina (labia) may be affected as well. °Pain or a burning feeling while urinating. °Pain during sex. °How is this diagnosed? °This condition is diagnosed based on: °Your medical history. °A physical exam. °A pelvic exam. Your health care provider will examine a sample of your vaginal discharge under a microscope. Your health care provider may send this sample for testing to confirm the diagnosis. °How is this treated? °This condition is treated with medicine. Medicines may be over-the-counter or prescription. You may be told to use one or more of the following: °Medicine that is taken by mouth (orally). °Medicine that is applied as a cream (topically). °Medicine that is inserted directly into the vagina (suppository). °Follow these instructions at home: °Take or apply over-the-counter and prescription medicines only as told by your health care provider. °Do not use tampons until your health care provider approves. °Do not have sex until your infection has  cleared. Sex can prolong or worsen your symptoms of infection. Ask your health care provider when it is safe to resume sexual activity. °Keep all follow-up visits. This is important. °How is this prevented? ° °Do not wear tight clothes, such as pantyhose or tight pants. °Wear breathable cotton underwear. °Do not use douches, perfumed soap, creams, or powders. °Wipe from front to back after using the toilet. °If you have diabetes, keep your blood sugar levels under control. °Ask your health care provider for other ways to prevent yeast infections. °Contact a health care provider if: °You have a fever. °Your symptoms go away and then return. °Your symptoms do not get better with treatment. °Your symptoms get worse. °You have new symptoms. °You develop blisters in or around your vagina. °You have blood coming from your vagina and it is not your menstrual period. °You develop pain in your abdomen. °Summary °Vaginal yeast infection is a condition that causes discharge as well as soreness, swelling, and redness (inflammation) of the vagina. °This condition is treated with medicine. Medicines may be over-the-counter or prescription. °Take or apply over-the-counter and prescription medicines only as told by your health care provider. °Do not douche. Resume sexual activity or use of tampons as instructed by your health care provider. °Contact a health care provider if your symptoms do not get better with treatment or your symptoms go away and then return. °This information is not intended to replace advice given to you by your health care provider. Make sure you discuss any questions you have with your health care provider. °Document Revised: 09/02/2020 Document Reviewed: 09/02/2020 °Elsevier Patient Education © 2022 [employer] ° °

## 2021-07-09 NOTE — Progress Notes (Signed)
Virtual Visit Consent   Julia Padilla, you are scheduled for a virtual visit with Mary-Margaret Daphine Deutscher, FNP, a [facility] provider, today.     Just as with appointments in the office, your consent must be obtained to participate.  Your consent will be active for this visit and any virtual visit you may have with one of our providers in the next 365 days.     If you have a MyChart account, a copy of this consent can be sent to you electronically.  All virtual visits are billed to your insurance company just like a traditional visit in the office.    As this is a virtual visit, video technology does not allow for your provider to perform a traditional examination.  This may limit your provider's ability to fully assess your condition.  If your provider identifies any concerns that need to be evaluated in person or the need to arrange testing (such as labs, EKG, etc.), we will make arrangements to do so.     Although advances in technology are sophisticated, we cannot ensure that it will always work on either your end or our end.  If the connection with a video visit is poor, the visit may have to be switched to a telephone visit.  With either a video or telephone visit, we are not always able to ensure that we have a secure connection.     I need to obtain your verbal consent now.   Are you willing to proceed with your visit today? YES   Julia D Zabriskie has provided verbal consent on 07/09/2021 for a virtual visit (video or telephone).   Mary-Margaret Daphine Deutscher, FNP   Date: 07/09/2021 12:38 PM   Virtual Visit via Video Note   I, Mary-Margaret Daphine Deutscher, connected with Julia Padilla ([id number], Feb 05, 1976) on 07/09/21 at 12:45 PM EST by a video-enabled telemedicine application and verified that I am speaking with the correct person using two identifiers.  Location: Patient: Virtual Visit Location Patient: Mobile Provider: Virtual Visit Location Provider: Mobile   I discussed the  limitations of evaluation and management by telemedicine and the availability of in person appointments. The patient expressed understanding and agreed to proceed.    History of Present Illness: Julia Padilla is a 46 y.o. who identifies as a female who was assigned female at birth, and is being seen today for female issues.  HPI: Patient said she went to urgent care in October. She was given antibiotic for a UTI. But after all test she was found to have BV and yeast. They told her to stop antibiotic. She took meds for BV and yeast , so she wnet back to urgent care and they said that there is not she denies frequency and urgencyhing they can do. She is having perineal itching and burning when ever she voids. She denies urgency and frequency. Has slight discharge awith fishy odor.   Review of Systems  Gastrointestinal:  Positive for abdominal pain.  Genitourinary:  Positive for dysuria. Negative for flank pain, frequency and urgency.   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   Asymptomatic bacteriuria during pregnancy in first trimester 08/01/2018   AMA (advanced maternal age) multigravida 35+ 07/25/2018   Vaginal cyst 07/25/2018   Pregnancy with history of cesarean section, antepartum 01/17/2018  Anemia, iron deficiency 07/09/2016    Allergies: No Known Allergies Medications:  Current Outpatient Medications:    acetaminophen (TYLENOL) 500 MG tablet, Take 1,000 mg by mouth every 6 (six) hours as needed for mild pain., Disp: , Rfl:    cephALEXin (KEFLEX) 500 MG capsule, Take 1 capsule (500 mg total) by mouth 2 (two) times daily., Disp: 10 capsule, Rfl: 0   Cyanocobalamin (VITAMIN B 12 PO), Take by mouth., Disp: , Rfl:    ibuprofen (ADVIL) 800 MG tablet, Take 1 tablet (800 mg total) by mouth 3 (three)  times daily., Disp: 21 tablet, Rfl: 0   metroNIDAZOLE (FLAGYL) 500 MG tablet, Take 1 tablet (500 mg total) by mouth 2 (two) times daily., Disp: 14 tablet, Rfl: 0   Prenatal Vit-Fe Fumarate-FA (PRENATAL MULTIVITAMIN) TABS tablet, Take 1 tablet by mouth daily at 12 noon., Disp: , Rfl:   Observations/Objective: Patient is well-developed, well-nourished in no acute distress.  Resting comfortably  at home.  Head is normocephalic, atraumatic.  No labored breathing.  Speech is clear and coherent with logical content.  Patient is alert and oriented at baseline.    Assessment and Plan:  Julia Padilla in today with chief complaint of vaginal issues  1. Vaginal candidiasis No bubble baths Dove- or mild soaps Void after intercourse Meds ordered this encounter  Medications   fluconazole (DIFLUCAN) 150 MG tablet    Sig: Take 1 tablet (150 mg total) by mouth once for 1 dose.    Dispense:  1 tablet    Refill:  0    Order Specific Question:   Supervising Provider    Answer:   MILLER, BRIAN [[id number]]   nystatin cream (MYCOSTATIN)    Sig: Apply 1 application topically 2 (two) times daily.    Dispense:  30 g    Refill:  0    Order Specific Question:   Supervising Provider    Answer:   Eber Hong [[id number]]      Follow Up Instructions: I discussed the assessment and treatment plan with the patient. The patient was provided an opportunity to ask questions and all were answered. The patient agreed with the plan and demonstrated an understanding of the instructions.  A copy of instructions were sent to the patient via MyChart.  The patient was advised to call back or seek an in-person evaluation if the symptoms worsen or if the condition fails to improve as anticipated.  Time:  I spent 14 minutes with the patient via telehealth technology discussing the above problems/concerns.    Mary-Margaret Daphine Deutscher, FNP

## 2021-07-14 ENCOUNTER — Telehealth: Payer: Medicaid Other

## 2021-07-15 ENCOUNTER — Telehealth: Payer: Medicaid Other | Admitting: Physician Assistant

## 2021-07-15 NOTE — Progress Notes (Signed)
The patient no-showed for appointment despite this provider sending direct link, reaching out via phone with no response and waiting for at least 10 minutes from appointment time for patient to join. They will be marked as a NS for this appointment/time.  ? ?Shahida Schnackenberg Cody Nickalas Mccarrick, PA-C ? ? ? ?

## 2021-07-16 ENCOUNTER — Telehealth: Payer: Medicaid Other | Admitting: Family

## 2021-07-16 DIAGNOSIS — R300 Dysuria: Secondary | ICD-10-CM | POA: Diagnosis not present

## 2021-07-16 DIAGNOSIS — R102 Pelvic and perineal pain: Secondary | ICD-10-CM

## 2021-07-16 DIAGNOSIS — N941 Unspecified dyspareunia: Secondary | ICD-10-CM

## 2021-07-16 NOTE — Progress Notes (Signed)
Virtual Visit Consent   Julia Padilla, you are scheduled for a virtual visit with a [facility] provider today.     Just as with appointments in the office, your consent must be obtained to participate.  Your consent will be active for this visit and any virtual visit you may have with one of our providers in the next 365 days.     If you have a MyChart account, a copy of this consent can be sent to you electronically.  All virtual visits are billed to your insurance company just like a traditional visit in the office.    As this is a virtual visit, video technology does not allow for your provider to perform a traditional examination.  This may limit your provider's ability to fully assess your condition.  If your provider identifies any concerns that need to be evaluated in person or the need to arrange testing (such as labs, EKG, etc.), we will make arrangements to do so.     Although advances in technology are sophisticated, we cannot ensure that it will always work on either your end or our end.  If the connection with a video visit is poor, the visit may have to be switched to a telephone visit.  With either a video or telephone visit, we are not always able to ensure that we have a secure connection.     I need to obtain your verbal consent now.   Are you willing to proceed with your visit today?    [name] has provided verbal consent on 07/16/2021 for a virtual visit (video or telephone).   [name], FNP   Date: 07/16/2021 12:50 PM   Virtual Visit via Video Note   I, [name], connected with  Julia Padilla  ([id number], Oct 30, 1975) on 07/16/21 at 12:45 PM EST by a video-enabled telemedicine application and verified that I am speaking with the correct person using two identifiers.  Location: Patient: Virtual Visit Location Patient: Car Provider: Virtual Visit Location Provider: Home Office   I discussed the limitations of evaluation and management by  telemedicine and the availability of in person appointments. The patient expressed understanding and agreed to proceed.    History of Present Illness: Julia Padilla is a 46 y.o. who identifies as a female who was assigned female at birth, and is being seen today for recurrent dysuria. She was seen on 07/09/21 and given nystatin and diflucan and states she is no longer having vaginal itching.   She reports she had something similar in Oct 2022 and was referred to Urologists. She states she was "sick" and had this rescheduled for 07/2021. She has a GYN visit scheduled for 07/2021.   HPI: Dysuria  This is a recurrent problem. The current episode started 1 to 4 weeks ago. The problem occurs intermittently. The quality of the pain is described as burning. The pain is mild.   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   Asymptomatic bacteriuria during pregnancy in first trimester 08/01/2018   AMA (advanced maternal age) multigravida 35+ 07/25/2018   Vaginal cyst 07/25/2018   Pregnancy with history of cesarean section, antepartum 01/17/2018   Anemia, iron deficiency 07/09/2016    Allergies: No Known Allergies Medications:  Current Outpatient Medications:    acetaminophen (TYLENOL) 500 MG tablet,  Take 1,000 mg by mouth every 6 (six) hours as needed for mild pain., Disp: , Rfl:    cephALEXin (KEFLEX) 500 MG capsule, Take 1 capsule (500 mg total) by mouth 2 (two) times daily., Disp: 10 capsule, Rfl: 0   Cyanocobalamin (VITAMIN B 12 PO), Take by mouth., Disp: , Rfl:    ibuprofen (ADVIL) 800 MG tablet, Take 1 tablet (800 mg total) by mouth 3 (three) times daily., Disp: 21 tablet, Rfl: 0   metroNIDAZOLE (FLAGYL) 500 MG tablet, Take 1 tablet (500 mg total) by mouth 2 (two) times daily., Disp: 14 tablet, Rfl: 0    nystatin cream (MYCOSTATIN), Apply 1 application topically 2 (two) times daily., Disp: 30 g, Rfl: 0   Prenatal Vit-Fe Fumarate-FA (PRENATAL MULTIVITAMIN) TABS tablet, Take 1 tablet by mouth daily at 12 noon., Disp: , Rfl:   Observations/Objective: Patient is well-developed, well-nourished in no acute distress.  Resting comfortably   Head is normocephalic, atraumatic.  No labored breathing.  Speech is clear and coherent with logical content.  Patient is alert and oriented at baseline.    Assessment and Plan: 1. Dysuria  2. Vaginal pain  3. Dyspareunia in female  Given symptoms, pt needs to be evaluated in person for further testing. She should keep GYN appointment.   Follow Up Instructions: I discussed the assessment and treatment plan with the patient. The patient was provided an opportunity to ask questions and all were answered. The patient agreed with the plan and demonstrated an understanding of the instructions.  A copy of instructions were sent to the patient via MyChart unless otherwise noted below.    The patient was advised to call back or seek an in-person evaluation if the symptoms worsen or if the condition fails to improve as anticipated.  Time:  I spent 14 minutes with the patient via telehealth technology discussing the above problems/concerns.    [name], FNP

## 2021-07-19 DIAGNOSIS — N390 Urinary tract infection, site not specified: Secondary | ICD-10-CM | POA: Diagnosis not present

## 2021-08-04 ENCOUNTER — Other Ambulatory Visit: Payer: Medicaid Other | Admitting: Adult Health

## 2021-08-08 ENCOUNTER — Encounter: Payer: Self-pay | Admitting: Adult Health

## 2021-08-08 ENCOUNTER — Other Ambulatory Visit: Payer: Self-pay

## 2021-08-08 ENCOUNTER — Other Ambulatory Visit (HOSPITAL_COMMUNITY)
Admission: RE | Admit: 2021-08-08 | Discharge: 2021-08-08 | Disposition: A | Discharge status: Home / Self Care | Payer: Medicaid Other | Source: Ambulatory Visit | Attending: Adult Health | Admitting: Adult Health

## 2021-08-08 ENCOUNTER — Ambulatory Visit (INDEPENDENT_AMBULATORY_CARE_PROVIDER_SITE_OTHER): Payer: Medicaid Other | Admitting: Adult Health

## 2021-08-08 VITALS — BP 137/87 | HR 106 | Ht 66.0 in | Wt 216.0 lb

## 2021-08-08 DIAGNOSIS — Z1211 Encounter for screening for malignant neoplasm of colon: Secondary | ICD-10-CM

## 2021-08-08 DIAGNOSIS — L509 Urticaria, unspecified: Secondary | ICD-10-CM

## 2021-08-08 DIAGNOSIS — Z13220 Encounter for screening for lipoid disorders: Secondary | ICD-10-CM

## 2021-08-08 DIAGNOSIS — Z1329 Encounter for screening for other suspected endocrine disorder: Secondary | ICD-10-CM | POA: Diagnosis not present

## 2021-08-08 DIAGNOSIS — F419 Anxiety disorder, unspecified: Secondary | ICD-10-CM

## 2021-08-08 DIAGNOSIS — N898 Other specified noninflammatory disorders of vagina: Secondary | ICD-10-CM

## 2021-08-08 DIAGNOSIS — Z01419 Encounter for gynecological examination (general) (routine) without abnormal findings: Secondary | ICD-10-CM | POA: Diagnosis not present

## 2021-08-08 DIAGNOSIS — Z1231 Encounter for screening mammogram for malignant neoplasm of breast: Secondary | ICD-10-CM | POA: Diagnosis not present

## 2021-08-08 DIAGNOSIS — A599 Trichomoniasis, unspecified: Secondary | ICD-10-CM | POA: Diagnosis not present

## 2021-08-08 DIAGNOSIS — R300 Dysuria: Secondary | ICD-10-CM

## 2021-08-08 LAB — HEMOCCULT GUIAC POC 1CARD (OFFICE): Fecal Occult Blood, POC: NEGATIVE

## 2021-08-08 LAB — POCT WET PREP (WET MOUNT)
Clue Cells Wet Prep Whiff POC: POSITIVE
WBC, Wet Prep HPF POC: POSITIVE

## 2021-08-08 LAB — POCT URINALYSIS DIPSTICK OB
Glucose, UA: NEGATIVE
Nitrite, UA: NEGATIVE
POC,PROTEIN,UA: NEGATIVE

## 2021-08-08 MED ORDER — BUSPIRONE HCL 5 MG PO TABS: 5.0000 mg | ORAL_TABLET | Freq: Three times a day (TID) | ORAL | 3 refills | Status: DC

## 2021-08-08 MED ORDER — METRONIDAZOLE 500 MG PO TABS: 500.0000 mg | ORAL_TABLET | Freq: Two times a day (BID) | ORAL | 0 refills | Status: DC

## 2021-08-08 MED ORDER — LEVOCETIRIZINE DIHYDROCHLORIDE 5 MG PO TABS: 5.0000 mg | ORAL_TABLET | Freq: Every evening | ORAL | 3 refills | Status: DC

## 2021-08-08 NOTE — Progress Notes (Signed)
Patient ID: Julia Padilla, female   DOB: 17-Apr-1976, 46 y.o.   MRN: [id number] History of Present Illness: Julia Padilla is a 46 year old white female, married, [id number], in for well woman gyn exam and pap. She has had recent BV and UTI treated at Urgent Care, and has had hive. No PCP.   Current Medications, Allergies, Past Medical History, Past Surgical History, Family History and Social History were reviewed in Julia Padilla.     Review of Systems: Patient denies any headaches, hearing loss, fatigue, blurred vision, shortness of breath, chest pain, abdominal pain, problems with bowel movements, urination,(burns at times) or intercourse.(Some pain with sex) No joint pain or mood swings. +anxious  +hives    Physical Exam:BP 137/87 (BP Location: Right Arm, Patient Position: Sitting, Cuff Size: Normal)    Pulse (!) 106    Ht 5\' 6"  (1.676 m)    Wt 216 lb (98 kg)    LMP 07/31/2021    BMI 34.86 kg/m  urine dipstick, trace ketones and blood and 3+leuks  General:  Well developed, well nourished, no acute distress Skin:  Warm and dry Neck:  Midline trachea, normal thyroid, good ROM, no lymphadenopathy Lungs; Clear to auscultation bilaterally Breast:  No dominant palpable mass, retraction, or nipple discharge Cardiovascular: Regular rate and rhythm Abdomen:  Soft, non tender, no hepatosplenomegaly Pelvic:  External genitalia is normal in appearance, no lesions.  The vagina has frothy discharge with odor, wet prep:+trich and +WBCs. Urethra has no lesions or masses. The cervix is bulbous. Pap with HR HPV genotyping performed. Uterus is felt to be normal size, shape, and contour.  No adnexal masses or tenderness noted.Bladder is non tender, no masses felt. Rectal: Good sphincter tone, no polyps, or hemorrhoids felt.  Hemoccult negative. Extremities/musculoskeletal:  No swelling or varicosities noted, no clubbing or cyanosis, has hives left hip area, red with whelps Psych:  No  mood changes, alert and cooperative,seems happy AA is 0 Depression screen [facility] 2/9 08/08/2021 07/25/2018 07/09/2016  Decreased Interest 0 0 0  Down, Depressed, Hopeless 0 0 0  PHQ - 2 Score 0 0 0  Altered sleeping 0 0 -  Tired, decreased energy 0 2 -  Change in appetite 0 0 -  Feeling bad or failure about yourself  0 0 -  Trouble concentrating 0 0 -  Moving slowly or fidgety/restless 0 0 -  Suicidal thoughts 0 0 -  PHQ-9 Score 0 2 -    GAD 7 : Generalized Anxiety Score 08/08/2021  Nervous, Anxious, on Edge 2  Control/stop worrying 2  Worry too much - different things 2  Trouble relaxing 2  Restless 2  Easily annoyed or irritable 2  Afraid - awful might happen 2  Total GAD 7 Score 14      Upstream - 08/08/21 1003       Pregnancy Intention Screening   Does the patient want to become pregnant in the next year? Yes    Does the patient's partner want to become pregnant in the next year? Yes    Would the patient like to discuss contraceptive options today? No      Contraception Wrap Up   Current Method Pregnant/Seeking Pregnancy    End Method Pregnant/Seeking Pregnancy    Contraception Counseling Provided No            Examination chaperoned by 10/06/21 LPN  Impression and Plan: 1. Encounter for gynecological examination with Papanicolaou smear of cervix Pap sent Physical  in 1 year Pap in 3 years if normal Will check labs  - Cytology - PAP( Julia Padilla) - CBC - Comprehensive metabolic panel  2. Dysuria - POC Urinalysis Dipstick OB  3. Vaginal discharge - POCT Wet Prep [facility])  4. Trichimoniasis Will rx flagyl 500 mg 1 bid x 7 days and rx given for partner ,Julia Padilla, [birth year]/29/76 flagyl 500 mg #4 4 po now Meds ordered this encounter  Medications   metroNIDAZOLE (FLAGYL) 500 MG tablet    Sig: Take 1 tablet (500 mg total) by mouth 2 (two) times daily.    Dispense:  14 tablet    Refill:  0    Order Specific Question:   Supervising Provider     Answer:   Duane Lope H [[id number]]   levocetirizine (XYZAL) 5 MG tablet    Sig: Take 1 tablet (5 mg total) by mouth every evening.    Dispense:  30 tablet    Refill:  3    Order Specific Question:   Supervising Provider    Answer:   Despina Hidden, LUTHER H [[id number]]   busPIRone (BUSPAR) 5 MG tablet    Sig: Take 1 tablet (5 mg total) by mouth 3 (three) times daily.    Dispense:  90 tablet    Refill:  3    Order Specific Question:   Supervising Provider    Answer:   Duane Lope H [[id number]]    - POCT Wet Prep [facility]) Will do POT in 2 weeks, no sex or alcohol  Review handout on trich  5. Encounter for screening fecal occult blood testing  - POCT occult blood stool  6. Screening mammogram for breast cancer Mammogram scheduled for her 08/11/21 at 12:45 pm at [facility]  - MM 3D SCREEN BREAST BILATERAL; Future  7. Screening for thyroid disorder - TSH  8. Screening cholesterol level  - Lipid panel  9. Hives Will rx xyzal daily   10. Anxiety Will rx Buspar 5 mg 1 tid

## 2021-08-09 LAB — CBC
Hematocrit: 26.8 % — ABNORMAL LOW (ref 34.0–46.6)
Hemoglobin: 7.8 g/dL — ABNORMAL LOW (ref 11.1–15.9)
MCH: 19.1 pg — ABNORMAL LOW (ref 26.6–33.0)
MCHC: 29.1 g/dL — ABNORMAL LOW (ref 31.5–35.7)
MCV: 66 fL — ABNORMAL LOW (ref 79–97)
Platelets: 336 10*3/uL (ref 150–450)
RBC: 4.09 x10E6/uL (ref 3.77–5.28)
RDW: 16.5 % — ABNORMAL HIGH (ref 11.7–15.4)
WBC: 5.6 10*3/uL (ref 3.4–10.8)

## 2021-08-09 LAB — COMPREHENSIVE METABOLIC PANEL
ALT: 8 IU/L (ref 0–32)
AST: 12 IU/L (ref 0–40)
Albumin/Globulin Ratio: 1.6 (ref 1.2–2.2)
Albumin: 4.4 g/dL (ref 3.8–4.8)
Alkaline Phosphatase: 97 IU/L (ref 44–121)
BUN/Creatinine Ratio: 12 (ref 9–23)
BUN: 10 mg/dL (ref 6–24)
Bilirubin Total: 0.6 mg/dL (ref 0.0–1.2)
CO2: 23 mmol/L (ref 20–29)
Calcium: 9.1 mg/dL (ref 8.7–10.2)
Chloride: 101 mmol/L (ref 96–106)
Creatinine, Ser: 0.82 mg/dL (ref 0.57–1.00)
Globulin, Total: 2.7 g/dL (ref 1.5–4.5)
Glucose: 107 mg/dL — ABNORMAL HIGH (ref 70–99)
Potassium: 4.2 mmol/L (ref 3.5–5.2)
Sodium: 136 mmol/L (ref 134–144)
Total Protein: 7.1 g/dL (ref 6.0–8.5)
eGFR: 91 mL/min/{1.73_m2} (ref >59)

## 2021-08-09 LAB — LIPID PANEL
Chol/HDL Ratio: 4.2 ratio (ref 0.0–4.4)
Cholesterol, Total: 178 mg/dL (ref 100–199)
HDL: 42 mg/dL (ref >39)
LDL Chol Calc (NIH): 121 mg/dL — ABNORMAL HIGH (ref 0–99)
Triglycerides: 80 mg/dL (ref 0–149)
VLDL Cholesterol Cal: 15 mg/dL (ref 5–40)

## 2021-08-09 LAB — TSH: TSH: 1.14 u[IU]/mL (ref 0.450–4.500)

## 2021-08-11 ENCOUNTER — Encounter: Payer: Self-pay | Admitting: Adult Health

## 2021-08-11 ENCOUNTER — Inpatient Hospital Stay (HOSPITAL_COMMUNITY): Admission: RE | Admit: 2021-08-11 | Payer: Medicaid Other | Source: Ambulatory Visit

## 2021-08-11 ENCOUNTER — Telehealth: Payer: Self-pay | Admitting: Adult Health

## 2021-08-11 DIAGNOSIS — D500 Iron deficiency anemia secondary to blood loss (chronic): Secondary | ICD-10-CM

## 2021-08-11 DIAGNOSIS — D649 Anemia, unspecified: Secondary | ICD-10-CM

## 2021-08-11 HISTORY — DX: Anemia, unspecified: D64.9

## 2021-08-11 NOTE — Telephone Encounter (Signed)
Pt aware of labs, take iron and will address bleeding with periods at follow up

## 2021-08-12 LAB — CYTOLOGY - PAP
Adequacy: ABSENT
Comment: NEGATIVE
Diagnosis: NEGATIVE
High risk HPV: NEGATIVE

## 2021-08-22 ENCOUNTER — Ambulatory Visit: Payer: Medicaid Other | Admitting: Adult Health

## 2022-02-09 ENCOUNTER — Ambulatory Visit
Admission: RE | Admit: 2022-02-09 | Discharge: 2022-02-09 | Disposition: A | Discharge status: Home / Self Care | Payer: Medicaid Other | Source: Ambulatory Visit | Attending: Nurse Practitioner | Admitting: Nurse Practitioner

## 2022-02-09 VITALS — BP 137/81 | HR 99 | Temp 98.1°F

## 2022-02-09 DIAGNOSIS — R300 Dysuria: Secondary | ICD-10-CM | POA: Insufficient documentation

## 2022-02-09 DIAGNOSIS — Z113 Encounter for screening for infections with a predominantly sexual mode of transmission: Secondary | ICD-10-CM | POA: Insufficient documentation

## 2022-02-09 DIAGNOSIS — Z3201 Encounter for pregnancy test, result positive: Secondary | ICD-10-CM | POA: Diagnosis not present

## 2022-02-09 LAB — POCT URINALYSIS DIP (MANUAL ENTRY)
Bilirubin, UA: NEGATIVE
Blood, UA: NEGATIVE
Glucose, UA: NEGATIVE mg/dL
Ketones, POC UA: NEGATIVE mg/dL
Leukocytes, UA: NEGATIVE
Nitrite, UA: NEGATIVE
Protein Ur, POC: 30 mg/dL — AB
Spec Grav, UA: >=1.03 — AB (ref 1.010–1.025)
Urobilinogen, UA: 0.2 E.U./dL
pH, UA: 6 (ref 5.0–8.0)

## 2022-02-09 LAB — POCT URINE PREGNANCY: Preg Test, Ur: POSITIVE — AB

## 2022-02-09 NOTE — ED Triage Notes (Signed)
Pt presents for possible pregnancy (positive test at home). Pt is having burning when she urinates. Has history of trichomonas, she was treated but unsure if her husband finished the treatment and would like to be retested.

## 2022-02-09 NOTE — Discharge Instructions (Addendum)
The pregnancy test is positive.  Cytology results are pending at this time.  A urine culture has also been ordered.  You will be contacted if the cytology results and urine culture results are positive to discuss treatment.. Increase fluids.  Try to drink at least 8-10 8 ounce glasses of water daily to see if this will help with your urinary symptoms. Please follow-up with obstetrics as soon as possible to establish prenatal care. Follow-up with your primary care physician if symptoms do not improve.

## 2022-02-09 NOTE — ED Provider Notes (Signed)
[facility]    CSN: [id number] Arrival date & time: 02/09/22  1745      History   Chief Complaint Chief Complaint  Patient presents with   Possible Pregnancy    Entered by patient   Appointment    HPI Julia Padilla is a 46 y.o. female.   The history is provided by the patient.   Patient presents for complaints of burning with urination has been present for the past 3 days.  She denies fever, chills, urinary frequency, urgency, hesitancy, hematuria, vaginal discharge, vaginal odor, vaginal itching, or abdominal pain.  Patient states that she was recently treated for trichomonas but she is unsure of her husband completed his treatment.  Patient would like to be retested for STIs.  She also states her last menstrual cycle was on 12/27/2021.  She is also requesting a pregnancy test today.  Patient took a pregnancy test at home which was positive.  Past Medical History:  Diagnosis Date   Anemia 08/11/2021   Will add iron    Anemia, iron deficiency 07/09/2016   Bacterial vaginosis    Miscarriage    No pertinent past medical history    Ovarian cyst    UTI (lower urinary tract infection)     Patient Active Problem List   Diagnosis Date Noted   Anemia 08/11/2021   Trichimoniasis 08/08/2021   Vaginal discharge 08/08/2021   Dysuria 08/08/2021   Encounter for gynecological examination with Papanicolaou smear of cervix 08/08/2021   Encounter for screening fecal occult blood testing 08/08/2021   Hives 08/08/2021   Screening cholesterol level 08/08/2021   Screening for thyroid disorder 08/08/2021   Screening mammogram for breast cancer 08/08/2021   Anxiety 08/08/2021   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   Miscarriage 10/01/2020   Abnormal genetic test during pregnancy 09/05/2018   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   Asymptomatic bacteriuria during pregnancy in first trimester 08/01/2018   AMA (advanced maternal age) multigravida 35+ 07/25/2018    Vaginal cyst 07/25/2018   Pregnancy with history of cesarean section, antepartum 01/17/2018   Anemia, iron deficiency 07/09/2016    Past Surgical History:  Procedure Laterality Date   CESAREAN SECTION     2000 and 2010   DILATION AND CURETTAGE OF UTERUS      OB History     Gravida  6   Para  2   Term  2   Preterm  0   AB  2   Living  2      SAB  1   IAB  0   Ectopic  0   Multiple  0   Live Births  2            Home Medications    Prior to Admission medications   Medication Sig Start Date End Date Taking? Authorizing Provider  busPIRone (BUSPAR) 5 MG tablet Take 1 tablet (5 mg total) by mouth 3 (three) times daily. 08/08/21   Adline Potter, NP  diphenhydrAMINE (BENADRYL) 25 MG tablet Take 25 mg by mouth every 6 (six) hours as needed.    [provider]  levocetirizine (XYZAL) 5 MG tablet Take 1 tablet (5 mg total) by mouth every evening. 08/08/21   Adline Potter, NP  metroNIDAZOLE (FLAGYL) 500 MG tablet Take 1 tablet (500 mg total) by mouth 2 (two) times daily. 08/08/21   Adline Potter, NP    Family History Family History  Problem Relation Age of Onset  Thyroid disease Maternal Aunt    Thyroid disease Maternal Grandmother    Stroke Maternal Grandmother    Heart disease Maternal Grandmother    Hyperlipidemia Maternal Grandmother    Hypertension Maternal Grandmother    Cancer Maternal Grandfather        skin   Anesthesia problems Neg Hx     Social History Social History   Tobacco Use   Smoking status: Never   Smokeless tobacco: Never  Vaping Use   Vaping Use: Never used  Substance Use Topics   Alcohol use: No   Drug use: No     Allergies   Patient has no known allergies.   Review of Systems Review of Systems Per HPI  Physical Exam Triage Vital Signs ED Triage Vitals  Enc Vitals Group     BP 02/09/22 1805 137/81     Pulse Rate 02/09/22 1805 99     Resp --      Temp 02/09/22 1805 98.1 F (36.7 C)      Temp Source 02/09/22 1805 Oral     SpO2 02/09/22 1805 99 %     Weight --      Height --      Head Circumference --      Peak Flow --      Pain Score 02/09/22 1809 0     Pain Loc --      Pain Edu? --      Excl. in GC? --    No data found.  Updated Vital Signs BP 137/81 (BP Location: Right Arm)   Pulse 99   Temp 98.1 F (36.7 C) (Oral)   LMP 12/27/2021   SpO2 99%   Visual Acuity Right Eye Distance:   Left Eye Distance:   Bilateral Distance:    Right Eye Near:   Left Eye Near:    Bilateral Near:     Physical Exam Vitals and nursing note reviewed.  Constitutional:      General: She is not in acute distress.    Appearance: Normal appearance.  HENT:     Head: Normocephalic.  Eyes:     Pupils: Pupils are equal, round, and reactive to light.  Cardiovascular:     Rate and Rhythm: Normal rate and regular rhythm.     Pulses: Normal pulses.     Heart sounds: Normal heart sounds.  Pulmonary:     Effort: Pulmonary effort is normal.     Breath sounds: Normal breath sounds.  Abdominal:     General: Bowel sounds are normal.     Palpations: Abdomen is soft.     Tenderness: There is no abdominal tenderness. There is no right CVA tenderness or left CVA tenderness.  Musculoskeletal:     Cervical back: Normal range of motion.  Lymphadenopathy:     Cervical: No cervical adenopathy.  Skin:    General: Skin is warm and dry.  Neurological:     General: No focal deficit present.     Mental Status: She is alert and oriented to person, place, and time.  Psychiatric:        Mood and Affect: Mood normal.        Behavior: Behavior normal.      UC Treatments / Results  Labs (all labs ordered are listed, but only abnormal results are displayed) Labs Reviewed  POCT URINE PREGNANCY - Abnormal; Notable for the following components:      Result Value   Preg Test, Ur Positive (*)  All other components within normal limits  POCT URINALYSIS DIP (MANUAL ENTRY) - Abnormal; Notable  for the following components:   Clarity, UA cloudy (*)    Spec Grav, UA >=1.030 (*)    Protein Ur, POC =30 (*)    All other components within normal limits  CERVICOVAGINAL ANCILLARY ONLY    EKG   Radiology No results found.  Procedures Procedures (including critical care time)  Medications Ordered in UC Medications - No data to display  Initial Impression / Assessment and Plan / UC Course  I have reviewed the triage vital signs and the nursing notes.  Pertinent labs & imaging results that were available during my care of the patient were reviewed by me and considered in my medical decision making (see chart for details).  Patient presents for dysuria, and requests pregnancy test and STI testing.  Patient's vital signs are stable, exam is benign.  Urine pregnancy test is positive.  Urinalysis does not provide a clear indication of urinary tract infection, urine culture is pending.  Cytology results are also pending at this time.  Supportive care recommendations were provided to the patient to include increasing fluids.  Patient advised to try to drink at least 8-10 8 ounce glasses of water daily at this may help with her symptoms.  Patient was advised that she will be contacted when her cytology results and culture results are received.  Patient advised to follow-up with obstetrics to establish prenatal care.  Follow-up as needed.  Final Clinical Impressions(s) / UC Diagnoses   Final diagnoses:  Screening examination for sexually transmitted disease   Discharge Instructions   None    ED Prescriptions   None    PDMP not reviewed this encounter.   Abran Cantor, NP 02/09/22 1845

## 2022-02-10 LAB — CERVICOVAGINAL ANCILLARY ONLY
Bacterial Vaginitis (gardnerella): POSITIVE — AB
Candida Glabrata: NEGATIVE
Candida Vaginitis: POSITIVE — AB
Chlamydia: NEGATIVE
Comment: NEGATIVE
Comment: NEGATIVE
Comment: NEGATIVE
Comment: NEGATIVE
Comment: NEGATIVE
Comment: NORMAL
Neisseria Gonorrhea: NEGATIVE
Trichomonas: NEGATIVE

## 2022-02-11 ENCOUNTER — Other Ambulatory Visit: Payer: Self-pay | Admitting: Adult Health

## 2022-02-11 ENCOUNTER — Telehealth (HOSPITAL_COMMUNITY): Payer: Self-pay | Admitting: Emergency Medicine

## 2022-02-11 MED ORDER — METRONIDAZOLE 0.75 % VA GEL: 1.0000 | Freq: Every day | VAGINAL | 0 refills | Status: AC

## 2022-02-11 MED ORDER — FLUCONAZOLE 150 MG PO TABS: ORAL_TABLET | ORAL | 1 refills | Status: DC

## 2022-02-11 MED ORDER — METRONIDAZOLE 500 MG PO TABS: 500.0000 mg | ORAL_TABLET | Freq: Two times a day (BID) | ORAL | 0 refills | Status: DC

## 2022-02-11 MED ORDER — CLOTRIMAZOLE 1 % VA CREA: 1.0000 | TOPICAL_CREAM | Freq: Every day | VAGINAL | 0 refills | Status: DC

## 2022-02-11 NOTE — Telephone Encounter (Signed)
Rx sent for flagyl and diflucan. No sex or alcohol while taking

## 2022-03-06 DIAGNOSIS — O3680X0 Pregnancy with inconclusive fetal viability, not applicable or unspecified: Secondary | ICD-10-CM | POA: Diagnosis not present

## 2022-03-06 DIAGNOSIS — O09521 Supervision of elderly multigravida, first trimester: Secondary | ICD-10-CM | POA: Diagnosis not present

## 2022-03-06 DIAGNOSIS — N96 Recurrent pregnancy loss: Secondary | ICD-10-CM | POA: Diagnosis not present

## 2022-03-06 DIAGNOSIS — Z3201 Encounter for pregnancy test, result positive: Secondary | ICD-10-CM | POA: Diagnosis not present

## 2022-03-06 DIAGNOSIS — N912 Amenorrhea, unspecified: Secondary | ICD-10-CM | POA: Diagnosis not present

## 2022-03-17 ENCOUNTER — Observation Stay (HOSPITAL_COMMUNITY)
Admission: EM | Admit: 2022-03-17 | Discharge: 2022-03-18 | Disposition: A | Discharge status: Home / Self Care | Payer: Medicaid Other | Attending: Obstetrics and Gynecology | Admitting: Obstetrics and Gynecology

## 2022-03-17 ENCOUNTER — Other Ambulatory Visit: Payer: Self-pay

## 2022-03-17 ENCOUNTER — Inpatient Hospital Stay (HOSPITAL_COMMUNITY): Payer: Medicaid Other

## 2022-03-17 ENCOUNTER — Encounter (HOSPITAL_COMMUNITY): Payer: Self-pay

## 2022-03-17 DIAGNOSIS — O039 Complete or unspecified spontaneous abortion without complication: Secondary | ICD-10-CM | POA: Diagnosis not present

## 2022-03-17 DIAGNOSIS — R52 Pain, unspecified: Secondary | ICD-10-CM | POA: Diagnosis not present

## 2022-03-17 DIAGNOSIS — Z98891 History of uterine scar from previous surgery: Secondary | ICD-10-CM | POA: Diagnosis not present

## 2022-03-17 DIAGNOSIS — Z3A00 Weeks of gestation of pregnancy not specified: Secondary | ICD-10-CM | POA: Diagnosis not present

## 2022-03-17 DIAGNOSIS — O4691 Antepartum hemorrhage, unspecified, first trimester: Secondary | ICD-10-CM | POA: Diagnosis present

## 2022-03-17 DIAGNOSIS — N939 Abnormal uterine and vaginal bleeding, unspecified: Secondary | ICD-10-CM | POA: Diagnosis not present

## 2022-03-17 DIAGNOSIS — Z79899 Other long term (current) drug therapy: Secondary | ICD-10-CM | POA: Insufficient documentation

## 2022-03-17 DIAGNOSIS — R58 Hemorrhage, not elsewhere classified: Secondary | ICD-10-CM | POA: Diagnosis not present

## 2022-03-17 DIAGNOSIS — O26891 Other specified pregnancy related conditions, first trimester: Secondary | ICD-10-CM | POA: Diagnosis not present

## 2022-03-17 DIAGNOSIS — Z90710 Acquired absence of both cervix and uterus: Secondary | ICD-10-CM | POA: Diagnosis not present

## 2022-03-17 DIAGNOSIS — O034 Incomplete spontaneous abortion without complication: Principal | ICD-10-CM | POA: Insufficient documentation

## 2022-03-17 DIAGNOSIS — D649 Anemia, unspecified: Secondary | ICD-10-CM | POA: Diagnosis not present

## 2022-03-17 DIAGNOSIS — O200 Threatened abortion: Secondary | ICD-10-CM | POA: Diagnosis not present

## 2022-03-17 DIAGNOSIS — N96 Recurrent pregnancy loss: Secondary | ICD-10-CM | POA: Diagnosis not present

## 2022-03-17 LAB — CBC WITH DIFFERENTIAL/PLATELET
Abs Immature Granulocytes: 0.03 10*3/uL (ref 0.00–0.07)
Basophils Absolute: 0 10*3/uL (ref 0.0–0.1)
Basophils Relative: 0 %
Eosinophils Absolute: 0.1 10*3/uL (ref 0.0–0.5)
Eosinophils Relative: 1 %
HCT: 33.4 % — ABNORMAL LOW (ref 36.0–46.0)
Hemoglobin: 9.7 g/dL — ABNORMAL LOW (ref 12.0–15.0)
Immature Granulocytes: 0 %
Lymphocytes Relative: 17 %
Lymphs Abs: 1.6 10*3/uL (ref 0.7–4.0)
MCH: 20.3 pg — ABNORMAL LOW (ref 26.0–34.0)
MCHC: 29 g/dL — ABNORMAL LOW (ref 30.0–36.0)
MCV: 70 fL — ABNORMAL LOW (ref 80.0–100.0)
Monocytes Absolute: 0.5 10*3/uL (ref 0.1–1.0)
Monocytes Relative: 6 %
Neutro Abs: 6.7 10*3/uL (ref 1.7–7.7)
Neutrophils Relative %: 76 %
Platelets: 350 10*3/uL (ref 150–400)
RBC: 4.77 MIL/uL (ref 3.87–5.11)
RDW: 19.2 % — ABNORMAL HIGH (ref 11.5–15.5)
WBC: 8.9 10*3/uL (ref 4.0–10.5)
nRBC: 0 % (ref 0.0–0.2)

## 2022-03-17 LAB — CBC
HCT: 24.1 % — ABNORMAL LOW (ref 36.0–46.0)
Hemoglobin: 7.3 g/dL — ABNORMAL LOW (ref 12.0–15.0)
MCH: 20.8 pg — ABNORMAL LOW (ref 26.0–34.0)
MCHC: 30.3 g/dL (ref 30.0–36.0)
MCV: 68.7 fL — ABNORMAL LOW (ref 80.0–100.0)
Platelets: 281 10*3/uL (ref 150–400)
RBC: 3.51 MIL/uL — ABNORMAL LOW (ref 3.87–5.11)
RDW: 18.7 % — ABNORMAL HIGH (ref 11.5–15.5)
WBC: 8.8 10*3/uL (ref 4.0–10.5)
nRBC: 0 % (ref 0.0–0.2)

## 2022-03-17 LAB — COMPREHENSIVE METABOLIC PANEL
ALT: 12 U/L (ref 0–44)
AST: 11 U/L — ABNORMAL LOW (ref 15–41)
Albumin: 4 g/dL (ref 3.5–5.0)
Alkaline Phosphatase: 71 U/L (ref 38–126)
Anion gap: 8 (ref 5–15)
BUN: 11 mg/dL (ref 6–20)
CO2: 22 mmol/L (ref 22–32)
Calcium: 8.9 mg/dL (ref 8.9–10.3)
Chloride: 106 mmol/L (ref 98–111)
Creatinine, Ser: 0.75 mg/dL (ref 0.44–1.00)
GFR, Estimated: >60 mL/min (ref >60)
Glucose, Bld: 102 mg/dL — ABNORMAL HIGH (ref 70–99)
Potassium: 3.7 mmol/L (ref 3.5–5.1)
Sodium: 136 mmol/L (ref 135–145)
Total Bilirubin: 0.6 mg/dL (ref 0.3–1.2)
Total Protein: 8.1 g/dL (ref 6.5–8.1)

## 2022-03-17 LAB — PREPARE RBC (CROSSMATCH)

## 2022-03-17 LAB — HCG, QUANTITATIVE, PREGNANCY: hCG, Beta Chain, Quant, S: 25750 m[IU]/mL — ABNORMAL HIGH (ref <5)

## 2022-03-17 MED ORDER — BUSPIRONE HCL 5 MG PO TABS
5.0000 mg | ORAL_TABLET | Freq: Three times a day (TID) | ORAL | Status: DC
  Filled 2022-03-17 (×4): qty 1

## 2022-03-17 MED ORDER — HYDROMORPHONE HCL 1 MG/ML IJ SOLN
INTRAMUSCULAR | Status: AC
  Filled 2022-03-17: qty 2

## 2022-03-17 MED ORDER — ACETAMINOPHEN 325 MG PO TABS
650.0000 mg | ORAL_TABLET | Freq: Once | ORAL | Status: AC
  Administered 2022-03-17: 650 mg via ORAL

## 2022-03-17 MED ORDER — LACTATED RINGERS IV SOLN: INTRAVENOUS | Status: DC

## 2022-03-17 MED ORDER — SODIUM CHLORIDE 0.9 % IV SOLN: INTRAVENOUS | Status: DC

## 2022-03-17 MED ORDER — SODIUM CHLORIDE 0.9 % IV SOLN
10.0000 mL/h | Freq: Once | INTRAVENOUS | Status: AC
  Administered 2022-03-17: 10 mL/h via INTRAVENOUS

## 2022-03-17 MED ORDER — DIPHENHYDRAMINE HCL 25 MG PO CAPS
ORAL_CAPSULE | ORAL | Status: AC
  Filled 2022-03-17: qty 1

## 2022-03-17 MED ORDER — IBUPROFEN 600 MG PO TABS: 600.0000 mg | ORAL_TABLET | Freq: Four times a day (QID) | ORAL | Status: DC

## 2022-03-17 MED ORDER — OXYCODONE-ACETAMINOPHEN 5-325 MG PO TABS: 1.0000 | ORAL_TABLET | ORAL | Status: DC | PRN

## 2022-03-17 MED ORDER — ONDANSETRON HCL 4 MG/2ML IJ SOLN
4.0000 mg | Freq: Once | INTRAMUSCULAR | Status: AC
  Administered 2022-03-17: 4 mg via INTRAVENOUS
  Filled 2022-03-17: qty 2

## 2022-03-17 MED ORDER — SODIUM CHLORIDE 0.9% IV SOLUTION: Freq: Once | INTRAVENOUS | Status: AC

## 2022-03-17 MED ORDER — ACETAMINOPHEN 325 MG PO TABS
ORAL_TABLET | ORAL | Status: AC
  Filled 2022-03-17: qty 2

## 2022-03-17 MED ORDER — HYDROMORPHONE HCL 1 MG/ML IJ SOLN
2.0000 mg | Freq: Once | INTRAMUSCULAR | Status: AC
  Administered 2022-03-17: 2 mg via INTRAVENOUS
  Filled 2022-03-17: qty 2

## 2022-03-17 MED ORDER — DIPHENHYDRAMINE HCL 25 MG PO CAPS
25.0000 mg | ORAL_CAPSULE | Freq: Once | ORAL | Status: AC
  Administered 2022-03-17: 25 mg via ORAL

## 2022-03-17 MED ORDER — HYDROMORPHONE HCL 1 MG/ML IJ SOLN
2.0000 mg | Freq: Once | INTRAMUSCULAR | Status: AC
  Administered 2022-03-17: 2 mg via INTRAVENOUS

## 2022-03-17 NOTE — MAU Note (Addendum)
MVA performed by Dr. Elly Modena   Assisting: C. Maryruth Hancock, CNM and [place], [place]

## 2022-03-17 NOTE — MAU Note (Signed)
Dr Constant at bedside. 

## 2022-03-17 NOTE — ED Notes (Signed)
EMS arrived for pt- Emergency blood not ready. Per Dr Sabra Heck- sent pt to MAU without the blood. Pt left via ems at this time.

## 2022-03-17 NOTE — MAU Note (Signed)
RN contacted main lab to ensure they had received pt's lab work. Lab confirmed they have the labs and will start processing them.

## 2022-03-17 NOTE — MAU Note (Signed)
.  Julia Padilla is a 46 y.o. at Unknown here in MAU reporting: transferred form [facility] via EMS. Reports she went in to [facility] for lower abdominal pressure and some spotting, while sitting in their triage, she started having heavy VB. Reports estimated [redacted]wks pregnant. Receives care at [facility] and was scheduled for another u/s tomorrow. Reports current lower abdominal cramping that is constant  LMP: July 5th  Onset of complaint: 1700 Pain score: 8 Vitals:   03/17/22 1900 03/17/22 2003  BP: 126/84 135/66  Pulse: 85 98  Resp:  18  Temp:  97.9 F (36.6 C)  SpO2: 100% 100%     FHT: NA  Lab orders placed from triage:  UA

## 2022-03-17 NOTE — ED Triage Notes (Signed)
Pt presents to ED with lower pelvic pressure, states she thinks she is having a miscarriage. Pt states she just started having vaginal bleeding.

## 2022-03-17 NOTE — ED Provider Notes (Signed)
[facility] EMERGENCY DEPARTMENT Provider Note   CSN: [id number] Arrival date & time: 03/17/22  1803     History  Chief Complaint  Patient presents with   Pelvic Pressure    Julia Padilla is a 45 y.o. female.  HPI   Patient is a 46 year old female, she is G6 at approximately [redacted] weeks pregnant, has had a confirmed ultrasound showing an abnormal pregnancy without a definite fetal pole.  Was supposed to follow-up with her GYN however today started having some pain and bleeding, on arrival the patient's bleeding has become very heavy, she is a little bit lightheaded, symptoms are gradually worsening and have now become severe.  She has increasing abdominal cramping with this.  Home Medications Prior to Admission medications   Medication Sig Start Date End Date Taking? Authorizing Provider  busPIRone (BUSPAR) 5 MG tablet Take 1 tablet (5 mg total) by mouth 3 (three) times daily. 08/08/21   Estill Dooms, NP  clotrimazole (GYNE-LOTRIMIN) 1 % vaginal cream Place 1 Applicatorful vaginally at bedtime. 02/11/22   LampteyMyrene Galas, MD  diphenhydrAMINE (BENADRYL) 25 MG tablet Take 25 mg by mouth every 6 (six) hours as needed.    [provider]  fluconazole (DIFLUCAN) 150 MG tablet Take 1 now and 1 in 3 days 02/11/22   Estill Dooms, NP  levocetirizine (XYZAL) 5 MG tablet Take 1 tablet (5 mg total) by mouth every evening. 08/08/21   Estill Dooms, NP  metroNIDAZOLE (FLAGYL) 500 MG tablet Take 1 tablet (500 mg total) by mouth 2 (two) times daily. 02/11/22   Estill Dooms, NP      Allergies    Patient has no known allergies.    Review of Systems   Review of Systems  All other systems reviewed and are negative.   Physical Exam Updated Vital Signs BP 134/69   Pulse 81   Temp 98.3 F (36.8 C)   Resp 16   Ht 1.702 m (5\' 7" )   Wt 102.5 kg   LMP 12/30/2021   SpO2 100%   BMI 35.40 kg/m  Physical Exam Vitals and nursing note reviewed.  Constitutional:       General: She is not in acute distress.    Appearance: She is well-developed.  HENT:     Head: Normocephalic and atraumatic.     Mouth/Throat:     Pharynx: No oropharyngeal exudate.  Eyes:     General: No scleral icterus.       Right eye: No discharge.        Left eye: No discharge.     Conjunctiva/sclera: Conjunctivae normal.     Pupils: Pupils are equal, round, and reactive to light.  Neck:     Thyroid: No thyromegaly.     Vascular: No JVD.  Cardiovascular:     Rate and Rhythm: Normal rate and regular rhythm.     Heart sounds: Normal heart sounds. No murmur heard.    No friction rub. No gallop.  Pulmonary:     Effort: Pulmonary effort is normal. No respiratory distress.     Breath sounds: Normal breath sounds. No wheezing or rales.  Abdominal:     General: Bowel sounds are normal. There is no distension.     Palpations: Abdomen is soft. There is no mass.     Tenderness: There is abdominal tenderness.     Comments: Minimal suprapubic tenderness  Genitourinary:    Comments: Chaperone present for exam, there is heavy vaginal bleeding  with large volume clots, multiple clots of come out, on bimanual exam there is no palpable masses however this facilitates the free flow of bright red blood Musculoskeletal:        General: No tenderness. Normal range of motion.     Cervical back: Normal range of motion and neck supple.  Lymphadenopathy:     Cervical: No cervical adenopathy.  Skin:    General: Skin is warm and dry.     Findings: No erythema or rash.  Neurological:     Mental Status: She is alert.     Coordination: Coordination normal.  Psychiatric:        Behavior: Behavior normal.     ED Results / Procedures / Treatments   Labs (all labs ordered are listed, but only abnormal results are displayed) Labs Reviewed  CBC WITH DIFFERENTIAL/PLATELET - Abnormal; Notable for the following components:      Result Value   Hemoglobin 9.7 (*)    HCT 33.4 (*)    MCV 70.0 (*)     MCH 20.3 (*)    MCHC 29.0 (*)    RDW 19.2 (*)    All other components within normal limits  COMPREHENSIVE METABOLIC PANEL  URINALYSIS, ROUTINE W REFLEX MICROSCOPIC  HCG, QUANTITATIVE, PREGNANCY  TYPE AND SCREEN    EKG None  Radiology No results found.  Procedures .Critical Care  Performed by: Noemi Chapel, MD Authorized by: Noemi Chapel, MD   Critical care provider statement:    Critical care time (minutes):  30   Critical care time was exclusive of:  Separately billable procedures and treating other patients and teaching time   Critical care was necessary to treat or prevent imminent or life-threatening deterioration of the following conditions: Hemorrhage.   Critical care was time spent personally by me on the following activities:  Development of treatment plan with patient or surrogate, discussions with consultants, evaluation of patient's response to treatment, examination of patient, ordering and review of laboratory studies, ordering and review of radiographic studies, ordering and performing treatments and interventions, pulse oximetry, re-evaluation of patient's condition and review of old charts   I assumed direction of critical care for this patient from another provider in my specialty: no     Care discussed with: admitting provider   Comments:           Medications Ordered in ED Medications - No data to display  ED Course/ Medical Decision Making/ A&P                           Medical Decision Making Risk Decision regarding hospitalization.   This patient presents to the ED for concern of vaginal bleeding, this involves an extensive number of treatment options, and is a complaint that carries with it a high risk of complications and morbidity.  The differential diagnosis includes what appears to be heavy vaginal bleeding on exam is more consistent with a miscarriage, the amount of bleeding is very suspicious for other pathologic bleeding as well and will  need to have GYN intervention   Co morbidities that complicate the patient evaluation  Multiple prior pregnancies   Additional history obtained:  Additional history obtained from electronic medical record External records from outside source obtained and reviewed including has seen OB/GYN out of this area but request transfer to [facility] to higher level of care   Lab Tests:  I Ordered, and personally interpreted labs.  The pertinent results include:  Patient has anemia around 9-1/2, she has had multiple units of lost blood at this time so I suspect that she is actually quite anemic after the acute blood loss.   Ultrasound not available at this hour   Cardiac Monitoring: / EKG:  The patient was maintained on a cardiac monitor.  I personally viewed and interpreted the cardiac monitored which showed an underlying rhythm of: Borderline tachycardia   Consultations Obtained:  I requested consultation with the Dr. Grandville Silos with GYN,  and discussed lab and imaging findings as well as pertinent plan - they recommend: Transfer to higher level of care   Problem List / ED Course / Critical interventions / Medication management  Heavy bleeding, 1 unit of packed red blood cells has been ordered as emergency release I ordered medication including blood transfusion for ongoing bleeding and acute blood loss anemia Reevaluation of the patient after these medicines showed that the patient critically ill but improving I have reviewed the patients home medicines and have made adjustments as needed   Social Determinants of Health:  Multiple pregnancies   Test / Admission - Considered:  Will need to be transferred for GYN admission         Final Clinical Impression(s) / ED Diagnoses Final diagnoses:  Vaginal hemorrhage    Rx / DC Orders ED Discharge Orders     None         Noemi Chapel, MD 03/17/22 1907

## 2022-03-17 NOTE — ED Notes (Signed)
Report to women's

## 2022-03-17 NOTE — MAU Note (Signed)
Ultrasound at bedside

## 2022-03-17 NOTE — ED Provider Triage Note (Signed)
Emergency Medicine Provider Triage Evaluation Note  Julia Padilla , a 46 y.o. female G6 P2 AB4 [redacted] weeks pregnant by LMP was evaluated in triage.  Pt complains of lower abdominal pain and pelvic pressure.  States that she began having pressure of her lower abdomen and pelvis while at work earlier today.  She left work around 5 PM today and pressure became worse and she noticed some gradually worsening vaginal bleeding.  She notes heavy bleeding upon arrival to the ER.  She is concerned that she is having a miscarriage.    Ultrasound performed at [facility] on 03/06/2022 that showed pregnancy with inconclusive fetal viability  Review of Systems  Positive: Lower abdominal/pelvic pain, vaginal bleeding Negative: Fever, chills, dysuria, vomiting or diarrhea  Physical Exam  BP (!) 149/82 (BP Location: Right Arm)   Pulse 94   Temp 98.3 F (36.8 C)   Resp 18   Ht 5\' 7"  (1.702 m)   Wt 102.5 kg   LMP 12/30/2021   SpO2 100%   BMI 35.40 kg/m  Gen:   Awake, patient grimacing and tearful Resp:  Normal effort  MSK:   Moves extremities without difficulty  Other:  Diffuse tenderness to mid lower abdomen.  No guarding or rebound tenderness.  Medical Decision Making  Medically screening exam initiated at 6:20 PM.  Appropriate orders placed.  Julia Padilla was informed that the remainder of the evaluation will be completed by another provider, this initial triage assessment does not replace that evaluation, and the importance of remaining in the ED until their evaluation is complete.  Patient here for vaginal bleeding and pressure of her lower abdomen and pelvis.  Concerning for threatened miscarriage.  She will need further evaluation in the emergency department, she is agreeable to this plan.   Kem Parkinson, PA-C 03/17/22 1841

## 2022-03-17 NOTE — H&P (Signed)
History     CSN: [id number]  Arrival date and time: 03/17/22 1803   Event Date/Time   First Provider Initiated Contact with Patient 03/17/22 2006      Chief Complaint  Patient presents with   Pelvic Pressure   HPI  Julia Padilla is a 46 y.o. [id number] who presents from [facility] ED for evaluation of vaginal bleeding. Patient reports at 1815 she went to [facility] because of heavy vaginal bleeding. She reports she has continued to bleed heavily since then. She also reports lower abdominal cramping. Patient rates the pain as a 8/10 and has not tried anything for the pain.   She was seen on 9/8 and told she likely had an abnormal pregnancy and was scheduled for more outpatient follow up at that time.    OB History     Gravida  7   Para  2   Term  2   Preterm  0   AB  2   Living  2      SAB  1   IAB  0   Ectopic  0   Multiple  0   Live Births  2           Past Medical History:  Diagnosis Date   Anemia 08/11/2021   Will add iron    Anemia, iron deficiency 07/09/2016   Bacterial vaginosis    Miscarriage    No pertinent past medical history    Ovarian cyst    UTI (lower urinary tract infection)     Past Surgical History:  Procedure Laterality Date   CESAREAN SECTION     2000 and 2010   DILATION AND CURETTAGE OF UTERUS      Family History  Problem Relation Age of Onset   Thyroid disease Maternal Aunt    Thyroid disease Maternal Grandmother    Stroke Maternal Grandmother    Heart disease Maternal Grandmother    Hyperlipidemia Maternal Grandmother    Hypertension Maternal Grandmother    Cancer Maternal Grandfather        skin   Anesthesia problems Neg Hx     Social History   Tobacco Use   Smoking status: Never   Smokeless tobacco: Never  Vaping Use   Vaping Use: Never used  Substance Use Topics   Alcohol use: No   Drug use: No    Allergies: No Known Allergies  Medications Prior to Admission  Medication Sig Dispense Refill  Last Dose   busPIRone (BUSPAR) 5 MG tablet Take 1 tablet (5 mg total) by mouth 3 (three) times daily. 90 tablet 3    clotrimazole (GYNE-LOTRIMIN) 1 % vaginal cream Place 1 Applicatorful vaginally at bedtime. 45 g 0    diphenhydrAMINE (BENADRYL) 25 MG tablet Take 25 mg by mouth every 6 (six) hours as needed.      fluconazole (DIFLUCAN) 150 MG tablet Take 1 now and 1 in 3 days 2 tablet 1    levocetirizine (XYZAL) 5 MG tablet Take 1 tablet (5 mg total) by mouth every evening. 30 tablet 3    metroNIDAZOLE (FLAGYL) 500 MG tablet Take 1 tablet (500 mg total) by mouth 2 (two) times daily. 14 tablet 0     Review of Systems  Constitutional: Negative.  Negative for fatigue and fever.  HENT: Negative.    Respiratory: Negative.  Negative for shortness of breath.   Cardiovascular: Negative.  Negative for chest pain.  Gastrointestinal:  Positive for abdominal pain. Negative for  constipation, diarrhea, nausea and vomiting.  Genitourinary:  Positive for vaginal bleeding. Negative for dysuria.  Neurological: Negative.  Negative for dizziness and headaches.   Physical Exam   Blood pressure 124/67, pulse 91, temperature 97.9 F (36.6 C), temperature source Oral, resp. rate 18, height 5\' 7"  (1.702 m), weight 102.5 kg, last menstrual period 12/31/2021, SpO2 97 %.  Patient Vitals for the past 24 hrs:  BP Temp Temp src Pulse Resp SpO2 Height Weight  03/17/22 2200 124/67 -- -- 91 -- -- -- --  03/17/22 2145 111/71 -- -- 88 -- 97 % -- --  03/17/22 2130 115/68 -- -- (!) 105 -- 96 % -- --  03/17/22 2100 108/76 -- -- (!) 101 -- 97 % -- --  03/17/22 2045 110/62 -- -- (!) 106 -- 96 % -- --  03/17/22 2032 122/71 -- -- (!) 105 -- 97 % -- --  03/17/22 2003 135/66 97.9 F (36.6 C) Oral 98 18 100 % -- --  03/17/22 1900 126/84 -- -- 85 -- 100 % -- --  03/17/22 1842 134/69 -- -- 81 16 100 % -- --  03/17/22 1816 (!) 149/82 98.3 F (36.8 C) -- 94 18 100 % -- --  03/17/22 1815 -- -- -- -- -- -- 5\' 7"  (1.702 m) 102.5 kg     Physical Exam Vitals and nursing note reviewed.  Constitutional:      General: She is not in acute distress.    Appearance: She is well-developed.  HENT:     Head: Normocephalic.  Eyes:     Pupils: Pupils are equal, round, and reactive to light.  Cardiovascular:     Rate and Rhythm: Normal rate and regular rhythm.     Heart sounds: Normal heart sounds.  Pulmonary:     Effort: Pulmonary effort is normal. No respiratory distress.     Breath sounds: Normal breath sounds.  Abdominal:     General: Bowel sounds are normal. There is no distension.     Palpations: Abdomen is soft.     Tenderness: There is no abdominal tenderness.  Genitourinary:    Comments: SSE: large amount of bright red blood and clots in vaginal vault. Removed with scopettes. Cervix visualized with steady bright red bleeding from os Skin:    General: Skin is warm and dry.  Neurological:     Mental Status: She is alert and oriented to person, place, and time.  Psychiatric:        Mood and Affect: Mood normal.        Behavior: Behavior normal.        Thought Content: Thought content normal.        Judgment: Judgment normal.       MAU Course  Procedures  Results for orders placed or performed during the hospital encounter of 03/17/22 (from the past 24 hour(s))  CBC with Differential     Status: Abnormal   Collection Time: 03/17/22  6:38 PM  Result Value Ref Range   WBC 8.9 4.0 - 10.5 K/uL   RBC 4.77 3.87 - 5.11 MIL/uL   Hemoglobin 9.7 (L) 12.0 - 15.0 g/dL   HCT 33.4 (L) 36.0 - 46.0 %   MCV 70.0 (L) 80.0 - 100.0 fL   MCH 20.3 (L) 26.0 - 34.0 pg   MCHC 29.0 (L) 30.0 - 36.0 g/dL   RDW 19.2 (H) 11.5 - 15.5 %   Platelets 350 150 - 400 K/uL   nRBC 0.0 0.0 - 0.2 %  Neutrophils Relative % 76 %   Neutro Abs 6.7 1.7 - 7.7 K/uL   Lymphocytes Relative 17 %   Lymphs Abs 1.6 0.7 - 4.0 K/uL   Monocytes Relative 6 %   Monocytes Absolute 0.5 0.1 - 1.0 K/uL   Eosinophils Relative 1 %   Eosinophils Absolute  0.1 0.0 - 0.5 K/uL   Basophils Relative 0 %   Basophils Absolute 0.0 0.0 - 0.1 K/uL   Immature Granulocytes 0 %   Abs Immature Granulocytes 0.03 0.00 - 0.07 K/uL  Comprehensive metabolic panel     Status: Abnormal   Collection Time: 03/17/22  6:38 PM  Result Value Ref Range   Sodium 136 135 - 145 mmol/L   Potassium 3.7 3.5 - 5.1 mmol/L   Chloride 106 98 - 111 mmol/L   CO2 22 22 - 32 mmol/L   Glucose, Bld 102 (H) 70 - 99 mg/dL   BUN 11 6 - 20 mg/dL   Creatinine, Ser 0.75 0.44 - 1.00 mg/dL   Calcium 8.9 8.9 - 10.3 mg/dL   Total Protein 8.1 6.5 - 8.1 g/dL   Albumin 4.0 3.5 - 5.0 g/dL   AST 11 (L) 15 - 41 U/L   ALT 12 0 - 44 U/L   Alkaline Phosphatase 71 38 - 126 U/L   Total Bilirubin 0.6 0.3 - 1.2 mg/dL   GFR, Estimated >60 >60 mL/min   Anion gap 8 5 - 15  hCG, quantitative, pregnancy     Status: Abnormal   Collection Time: 03/17/22  6:38 PM  Result Value Ref Range   hCG, Beta Chain, Quant, S 25,750 (H) <5 mIU/mL  Type and screen [facility]     Status: None   Collection Time: 03/17/22  6:38 PM  Result Value Ref Range   ABO/RH(D) O POS    Antibody Screen NEG    Sample Expiration      03/20/2022,2359 Performed at [facility], [address], [place], [place] [address]   Prepare RBC (crossmatch)     Status: None   Collection Time: 03/17/22  7:09 PM  Result Value Ref Range   Order Confirmation      ORDER PROCESSED BY BLOOD BANK Performed at [facility], [address], [place], [place] [address]   CBC     Status: Abnormal   Collection Time: 03/17/22  9:04 PM  Result Value Ref Range   WBC 8.8 4.0 - 10.5 K/uL   RBC 3.51 (L) 3.87 - 5.11 MIL/uL   Hemoglobin 7.3 (L) 12.0 - 15.0 g/dL   HCT 24.1 (L) 36.0 - 46.0 %   MCV 68.7 (L) 80.0 - 100.0 fL   MCH 20.8 (L) 26.0 - 34.0 pg   MCHC 30.3 30.0 - 36.0 g/dL   RDW 18.7 (H) 11.5 - 15.5 %   Platelets 281 150 - 400 K/uL   nRBC 0.0 0.0 - 0.2 %  Type and screen [place]     Status: None   Collection Time:  03/17/22  9:04 PM  Result Value Ref Range   ABO/RH(D) O POS    Antibody Screen NEG    Sample Expiration      03/20/2022,2359 Performed at [facility], [address], [place], [place] [address]      US OB Transvaginal  Result Date: 03/17/2022 CLINICAL DATA:  Pelvic pain and positive pregnancy test EXAM: TRANSVAGINAL OB ULTRASOUND TECHNIQUE: Transvaginal ultrasound was performed for complete evaluation of the gestation as well as  the maternal uterus, adnexal regions, and pelvic cul-de-sac. COMPARISON:  None Available. FINDINGS: Intrauterine gestational sac: Absent Maternal uterus/adnexae: Endometrial stripe measures 2.1 cm. Slight increased blood flow is noted in the endometrial canal within the lower uterine segment. Ovaries are within normal limits. IMPRESSION: No intrauterine gestation is identified. By history, a recent IUP was noted on ultrasound although these films are not available for comparison. Some vascularity is noted in the lower uterine segment with mild mobility identified. These changes are consistent with miscarriage in progress. Multiple clots are noted within the vagina on physical exam. Electronically Signed   By: Inez Catalina M.D.   On: 03/17/2022 21:14     MDM Labs ordered and reviewed.   CBC Type and Screen Normal Saline IV Dilaudid 2mg  IV  Upon arrival, patient had blood loss of 510ml. 2030- pelvic exam with nothing in cervical os with several large clots removed. QBL now 775 US Transvaginal  2135- QBL 1029 Dr. Elly Modena notified of persistent heavy vaginal bleeding and current QBL. Will come to bedside.   MD discussed MVA with patient and patient agreeable to plan of care. Dilaudid 2mg  given again and MVA completed by MD  Hgb now 7.3. Will admit to [facility]  Total QBL in MAU 1058ml  Assessment and Plan   1. Spontaneous miscarriage   2. Retained products of conception after miscarriage    -Admit to [place] turned over to MD  Wende Mott,  CNM 03/17/2022, 10:10 PM

## 2022-03-17 NOTE — ED Notes (Signed)
Blood bank notiifed re: Dr. Ammie Ferrier order for emergency release blood for pt, RN notified

## 2022-03-18 DIAGNOSIS — D649 Anemia, unspecified: Secondary | ICD-10-CM | POA: Diagnosis not present

## 2022-03-18 LAB — HEMOGLOBIN AND HEMATOCRIT, BLOOD
HCT: 28.6 % — ABNORMAL LOW (ref 36.0–46.0)
Hemoglobin: 8.9 g/dL — ABNORMAL LOW (ref 12.0–15.0)

## 2022-03-18 MED ORDER — IRON (FERROUS SULFATE) 325 (65 FE) MG PO TABS: 1.0000 | ORAL_TABLET | ORAL | 1 refills | Status: AC

## 2022-03-18 MED ORDER — SODIUM CHLORIDE 0.9 % IV BOLUS
1000.0000 mL | Freq: Once | INTRAVENOUS | Status: AC
  Administered 2022-03-18: 1000 mL via INTRAVENOUS

## 2022-03-18 MED ORDER — DOXYCYCLINE HYCLATE 100 MG IV SOLR
200.0000 mg | Freq: Once | INTRAVENOUS | Status: AC
  Administered 2022-03-18: 200 mg via INTRAVENOUS
  Filled 2022-03-18: qty 100

## 2022-03-18 MED ORDER — IBUPROFEN 600 MG PO TABS: 600.0000 mg | ORAL_TABLET | Freq: Four times a day (QID) | ORAL | 0 refills | Status: AC

## 2022-03-18 NOTE — Discharge Summary (Signed)
Physician Discharge Summary  Patient ID: Julia Padilla MRN: [id number] DOB/AGE: [birth year] 46 y.o.  Admit date: 03/17/2022 Discharge date: 03/18/2022  Admission Diagnoses: symptomatic anemia following spontaneous miscarriage  Discharge Diagnoses: Spontaneous miscarriage Principal Problem:   Symptomatic anemia   Discharged Condition: good  Hospital Course: Patient transferred from [facility] ED due to excessive vaginal bleeding. Patient was seen in [place] earlier this month and diagnosed with pregnancy of uncertain viability at 9 weeks. She presented to the ED due to onset of vaginal bleeding. Patient was transferred for further evaluation and ultrasound demonstrated miscarriage in process. Due to excessive blood loss estimated to be 1062 cc, decision was made to expedite completion of spontaneous abortion with Manual vacuum aspiration. The patient tolerated the procedure well and vaginal bleeding became scant. A significant drop in hemoglobin was noted (9.7 to 7.3) and patient reported feeling dizzy and lightheaded. Patient was admitted to receive 3 units of pRBC. Her post transfusion CBC improved. Patient was found stable for discharge.   Consults: None  Significant Diagnostic Studies: labs: Hg 9.7 to 7.3 and radiology: US OB Transvaginal  Result Date: 03/17/2022 CLINICAL DATA:  Pelvic pain and positive pregnancy test EXAM: TRANSVAGINAL OB ULTRASOUND TECHNIQUE: Transvaginal ultrasound was performed for complete evaluation of the gestation as well as the maternal uterus, adnexal regions, and pelvic cul-de-sac. COMPARISON:  None Available. FINDINGS: Intrauterine gestational sac: Absent Maternal uterus/adnexae: Endometrial stripe measures 2.1 cm. Slight increased blood flow is noted in the endometrial canal within the lower uterine segment. Ovaries are within normal limits. IMPRESSION: No intrauterine gestation is identified. By history, a recent IUP was noted on ultrasound although these films  are not available for comparison. Some vascularity is noted in the lower uterine segment with mild mobility identified. These changes are consistent with miscarriage in progress. Multiple clots are noted within the vagina on physical exam. Electronically Signed   By: Alcide Clever M.D.   On: 03/17/2022 21:14     Treatments: procedures: manual vacuum aspiration and blood transfusion  Discharge Exam: Blood pressure 118/67, pulse 85, temperature 97.9 F (36.6 C), temperature source Oral, resp. rate 18, height 5\' 7"  (1.702 m), weight 102.5 kg, last menstrual period 12/31/2021, SpO2 99 %, unknown if currently breastfeeding. Lungs clear Heart RRR Abd soft + BS GU deferred Ext non tender  Disposition: Discharge disposition: 01-Home or Self Care       Discharge Instructions     Call MD for:  difficulty breathing, headache or visual disturbances   Complete by: As directed    Call MD for:  extreme fatigue   Complete by: As directed    Call MD for:  hives   Complete by: As directed    Call MD for:  persistant dizziness or light-headedness   Complete by: As directed    Call MD for:  persistant nausea and vomiting   Complete by: As directed    Call MD for:  redness, tenderness, or signs of infection (pain, swelling, redness, odor or green/yellow discharge around incision site)   Complete by: As directed    Call MD for:  severe uncontrolled pain   Complete by: As directed    Call MD for:  temperature >100.4   Complete by: As directed    Diet general   Complete by: As directed    Increase activity slowly   Complete by: As directed    No wound care   Complete by: As directed    Sexual Activity Restrictions   Complete  by: As directed    Pelvic rest x 4 weeks      Allergies as of 03/18/2022   No Known Allergies      Medication List     STOP taking these medications    clotrimazole 1 % vaginal cream Commonly known as: GYNE-LOTRIMIN   diphenhydrAMINE 25 MG tablet Commonly  known as: BENADRYL   fluconazole 150 MG tablet Commonly known as: DIFLUCAN   metroNIDAZOLE 500 MG tablet Commonly known as: FLAGYL       TAKE these medications    busPIRone 5 MG tablet Commonly known as: BUSPAR Take 1 tablet (5 mg total) by mouth 3 (three) times daily.   ibuprofen 600 MG tablet Commonly known as: ADVIL Take 1 tablet (600 mg total) by mouth every 6 (six) hours.   Iron (Ferrous Sulfate) 325 (65 Fe) MG Tabs Take 1 tablet by mouth every other day.   levocetirizine 5 MG tablet Commonly known as: XYZAL Take 1 tablet (5 mg total) by mouth every evening.         Signed: Chancy Milroy 03/18/2022, 12:32 PM

## 2022-03-18 NOTE — MAU Note (Signed)
Anora kit completed and give to [facility].

## 2022-03-19 LAB — BPAM RBC
Blood Product Expiration Date: 202310202359
Blood Product Expiration Date: 202310202359
Blood Product Expiration Date: 202310202359
ISSUE DATE / TIME: 202309192324
ISSUE DATE / TIME: 202309200240
ISSUE DATE / TIME: 202309200501
Unit Type and Rh: 5100
Unit Type and Rh: 5100
Unit Type and Rh: 5100

## 2022-03-19 LAB — TYPE AND SCREEN
ABO/RH(D): O POS
Antibody Screen: NEGATIVE
Unit division: 0
Unit division: 0
Unit division: 0

## 2022-03-19 LAB — SURGICAL PATHOLOGY

## 2022-03-20 LAB — TYPE AND SCREEN
ABO/RH(D): O POS
Antibody Screen: NEGATIVE

## 2022-07-05 IMAGING — US US OB < 14 WEEKS - US OB TV
1 series · 14 of 28 positions shown · non-contrast
Comparison: 10/19/2018.

CLINICAL DATA: Pregnancy.  Pelvic pain.  Spotting.

EXAM:
OBSTETRIC <14 WK US AND TRANSVAGINAL OB US
TECHNIQUE: Both transabdominal and transvaginal ultrasound examinations were
performed for complete evaluation of the gestation as well as the
maternal uterus, adnexal regions, and pelvic cul-de-sac.
Transvaginal technique was performed to assess early pregnancy.

[Series 1: us ob less than 14 weeks with ob transvaginal · 65 acquisitions, 14 frames shown]
[im 3/65]
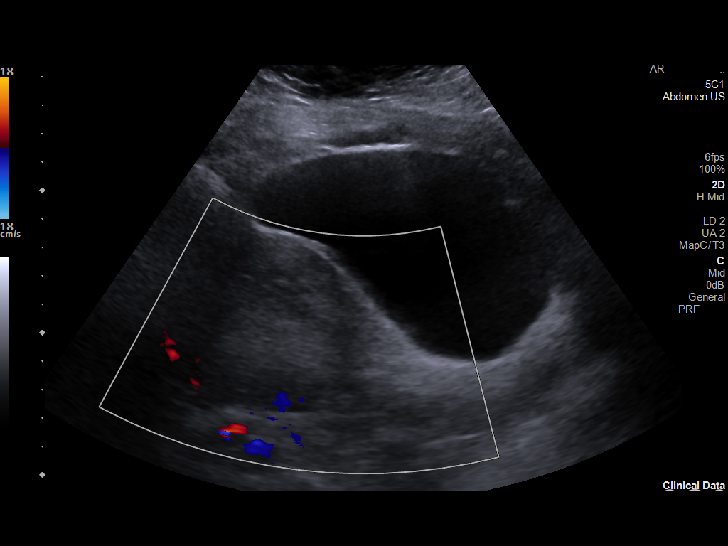
[im 8/65]
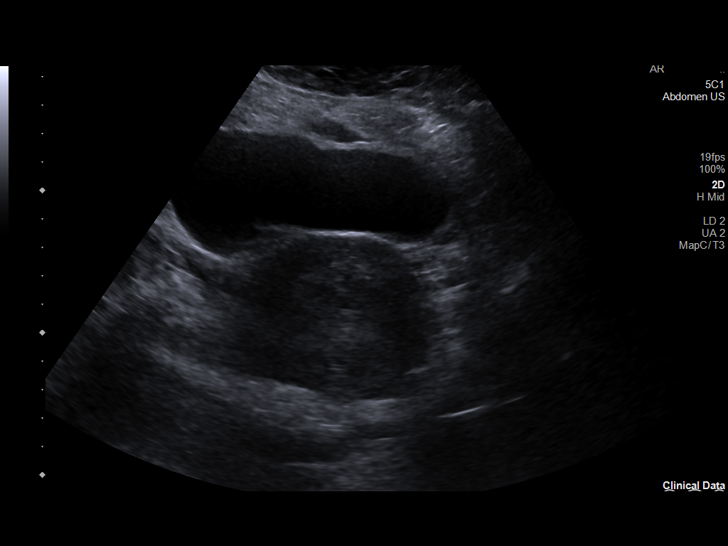
[im 12/65]
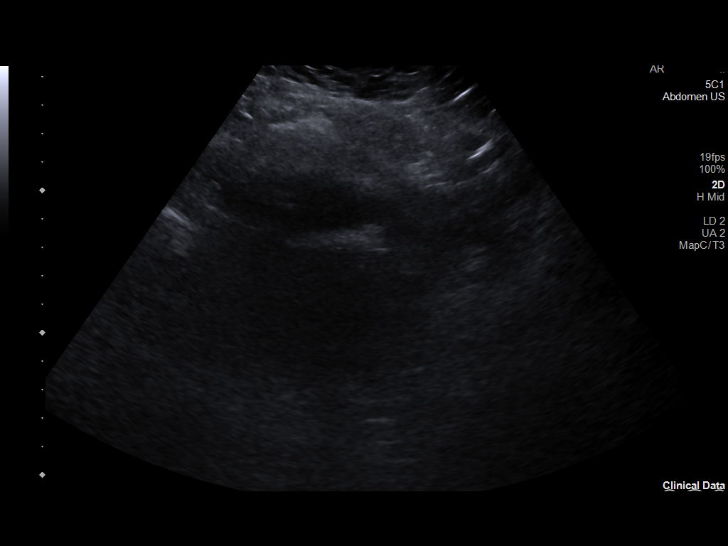
[im 17/65]
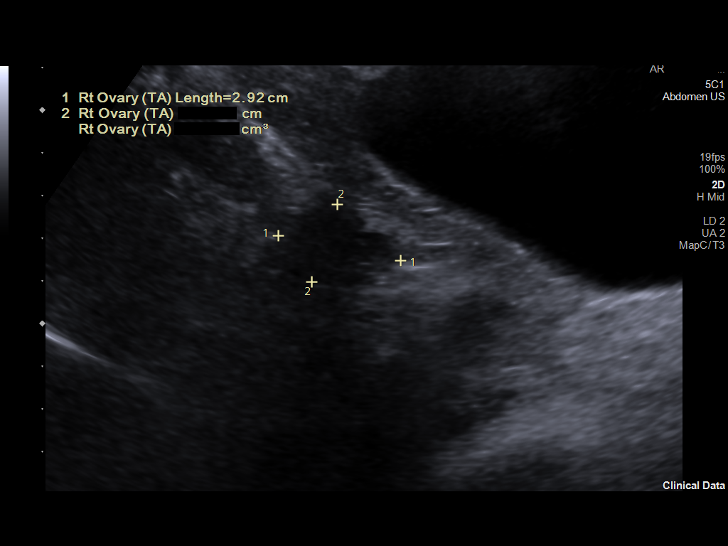
[im 22/65]
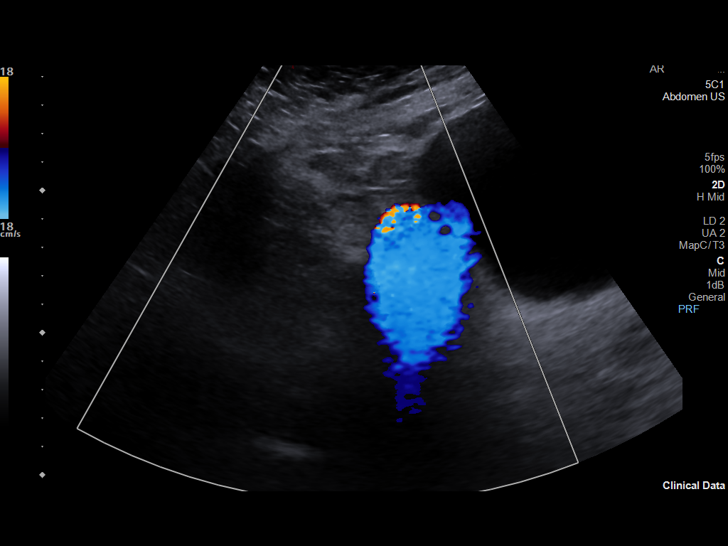
[im 27/65]
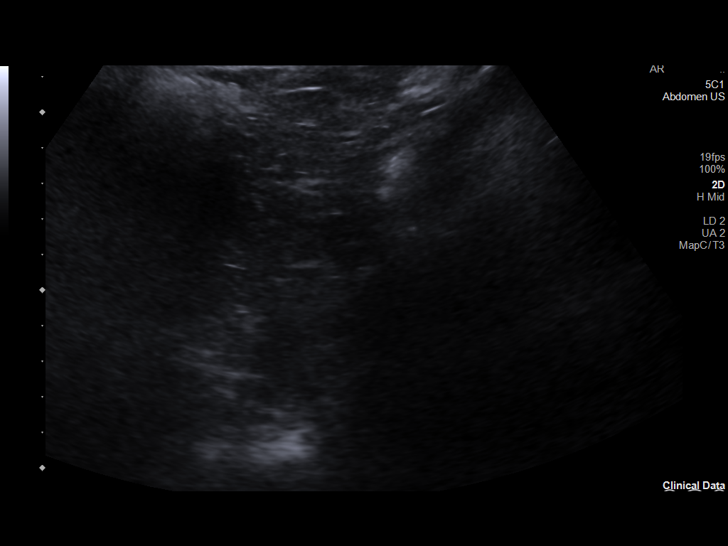
[im 31/65]
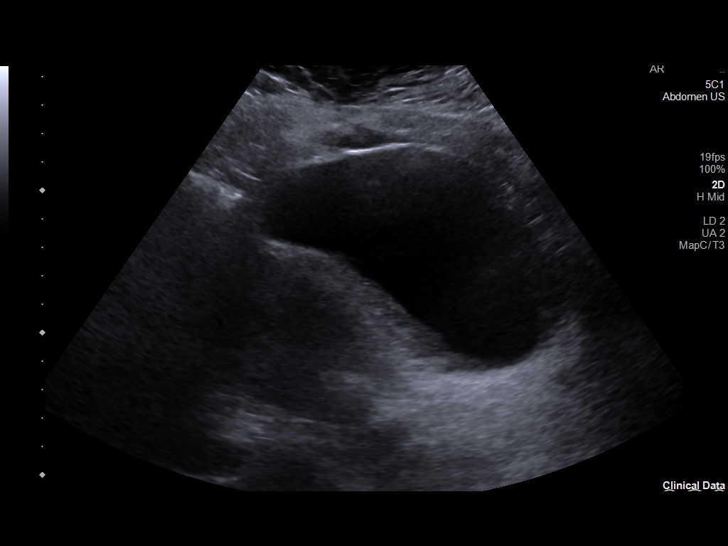
[im 36/65]
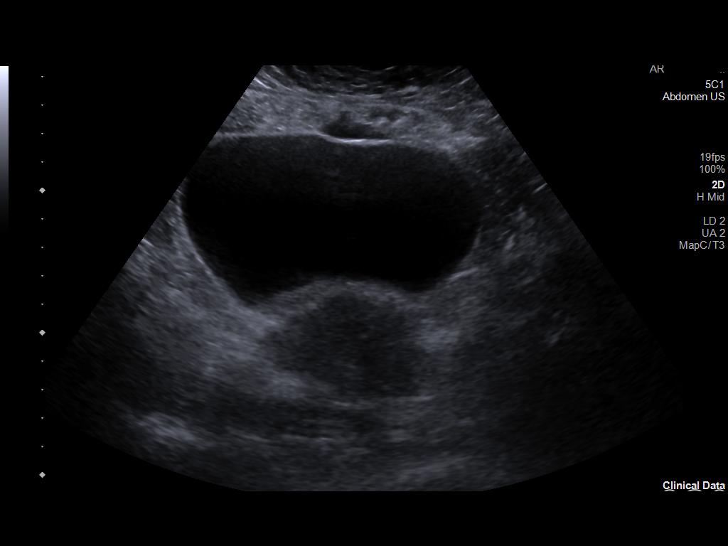
[im 41/65]
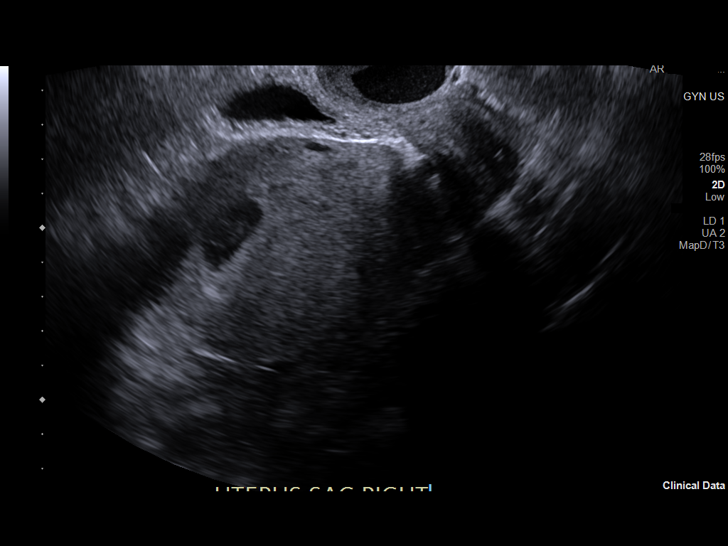
[im 46/65]
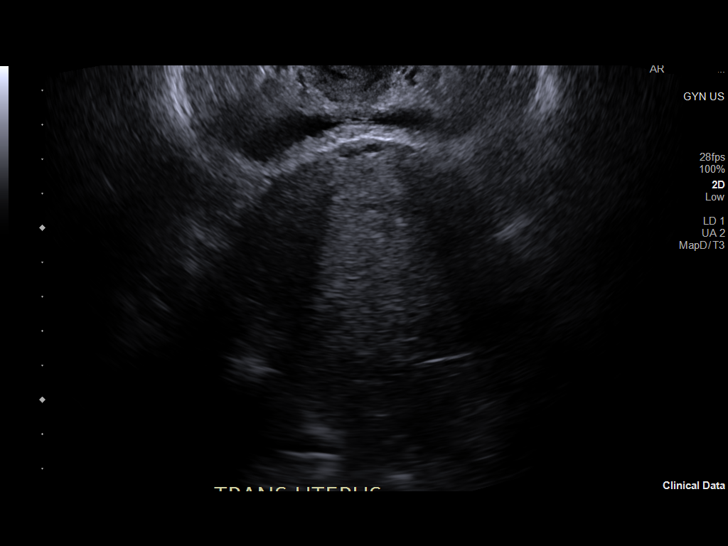
[im 50/65]
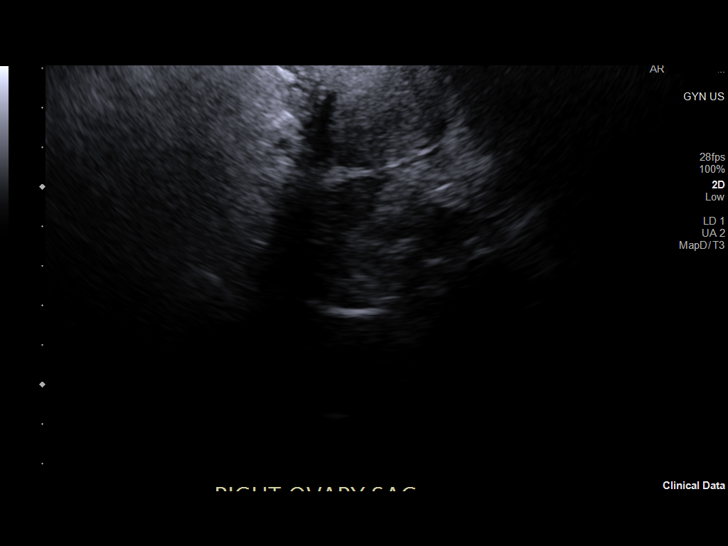
[im 55/65]
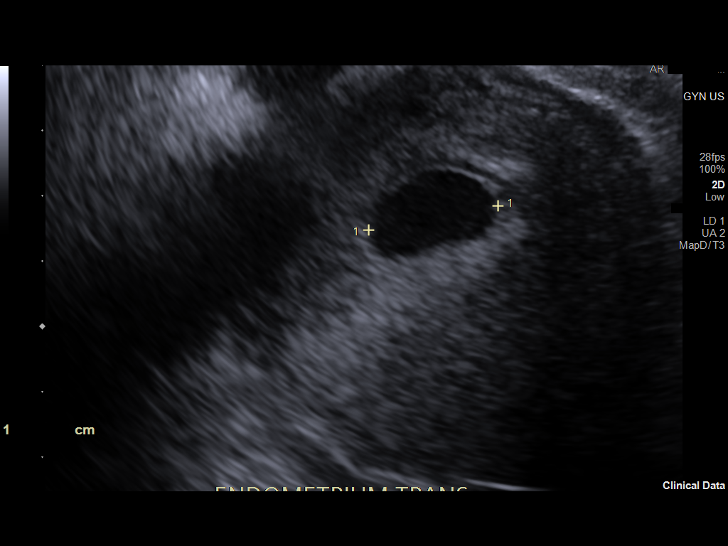
[im 60/65]
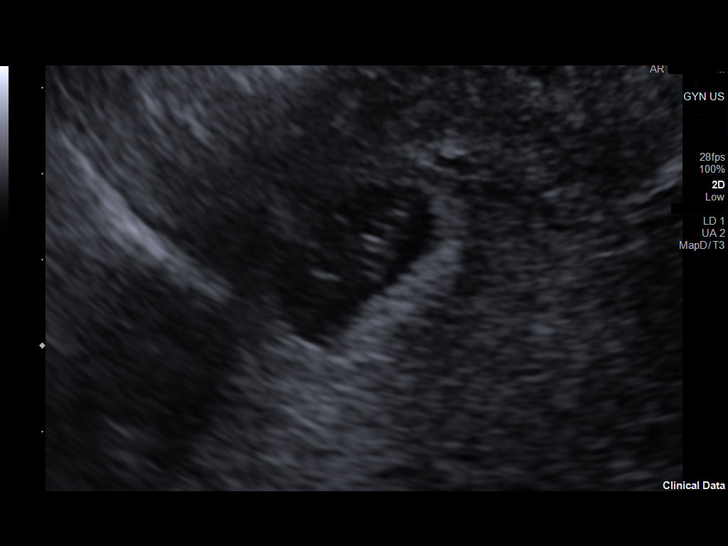
[im 65/65]
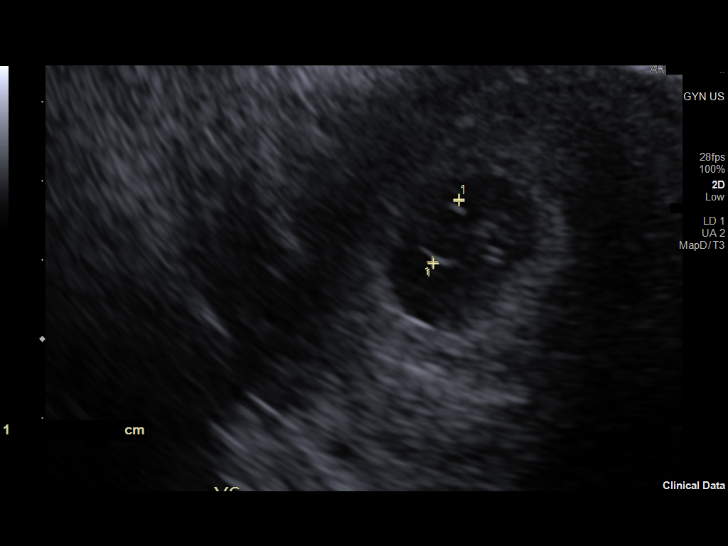

[14 of 28 positions shown; findings below may reference images not displayed]

FINDINGS: Intrauterine gestational sac: Single

Yolk sac:  Visualized.

Embryo:  Visualized.

Cardiac Activity: Visualized.

Heart Rate: 120 bpm

CRL:  10.8 mm   7 w   1 d                  US EDC: 04/26/2021

Subchorionic hemorrhage:  None visualized.

Maternal uterus/adnexae: 2.7 cm cyst with low level internal echoes
at the level of the cervix, likely nabothian cyst. Bilateral ovaries
within normal limits. No free fluid within the pelvis.
IMPRESSION: 1. Single live intrauterine gestation measuring 7 weeks 1 day by
crown-rump length.
2. Mildly complex nabothian cyst measuring 2.7 cm (previously
measured 2.1 cm on 10/19/2018).

## 2022-09-24 ENCOUNTER — Other Ambulatory Visit: Payer: Self-pay | Admitting: Adult Health

## 2022-11-03 ENCOUNTER — Ambulatory Visit: Payer: Medicaid Other | Admitting: Adult Health

## 2023-02-16 ENCOUNTER — Ambulatory Visit: Payer: Medicaid Other | Admitting: Family Medicine

## 2023-07-15 ENCOUNTER — Telehealth: Payer: Medicaid Other

## 2023-12-06 ENCOUNTER — Ambulatory Visit
Admission: EM | Admit: 2023-12-06 | Discharge: 2023-12-06 | Disposition: A | Discharge status: Home / Self Care | Attending: Family Medicine | Admitting: Family Medicine

## 2023-12-06 DIAGNOSIS — R300 Dysuria: Secondary | ICD-10-CM | POA: Diagnosis not present

## 2023-12-06 LAB — POCT URINALYSIS DIP (MANUAL ENTRY)
Blood, UA: NEGATIVE
Glucose, UA: NEGATIVE mg/dL
Ketones, POC UA: NEGATIVE mg/dL
Leukocytes, UA: NEGATIVE
Nitrite, UA: NEGATIVE
Protein Ur, POC: 30 mg/dL — AB
Spec Grav, UA: >=1.03 — AB (ref 1.010–1.025)
Urobilinogen, UA: 0.2 U/dL
pH, UA: 5.5 (ref 5.0–8.0)

## 2023-12-06 NOTE — ED Provider Notes (Signed)
 [facility]    CSN: [id number] Arrival date & time: 12/06/23  1752      History   Chief Complaint No chief complaint on file.   HPI Julia Padilla is a 48 y.o. female.   Patient presenting today with several day history of dysuria, mid line low back aching.  Denies fever, chills, hematuria, vaginal discharge, pelvic or abdominal pain, nausea vomiting or diarrhea.  So far not trying anything over-the-counter for symptoms.  States history of UTIs and vaginal infections.    Past Medical History:  Diagnosis Date   Anemia 08/11/2021   Will add iron     Anemia, iron  deficiency 07/09/2016   Bacterial vaginosis    Miscarriage    No pertinent past medical history    Ovarian cyst    UTI (lower urinary tract infection)     Patient Active Problem List   Diagnosis Date Noted   Symptomatic anemia 03/17/2022   Anemia 08/11/2021   Trichomoniasis 08/08/2021   Vaginal discharge 08/08/2021   Dysuria 08/08/2021   Encounter for gynecological examination with Papanicolaou smear of cervix 08/08/2021   Encounter for screening fecal occult blood testing 08/08/2021   Hives 08/08/2021   Screening cholesterol level 08/08/2021   Screening for thyroid  disorder 08/08/2021   Screening mammogram for breast cancer 08/08/2021   Anxiety 08/08/2021   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   Miscarriage 10/01/2020   Abnormal genetic test during pregnancy 09/05/2018   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   Asymptomatic bacteriuria during pregnancy in first trimester 08/01/2018   AMA (advanced maternal age) multigravida 35+ 07/25/2018   Vaginal cyst 07/25/2018   Pregnancy with history of cesarean section, antepartum 01/17/2018   Anemia, iron  deficiency 07/09/2016    Past Surgical History:  Procedure Laterality Date   CESAREAN SECTION     2000 and 2010   DILATION AND CURETTAGE OF UTERUS      OB History     Gravida  7   Para  2   Term  2   Preterm  0   AB  2    Living  2      SAB  1   IAB  0   Ectopic  0   Multiple  0   Live Births  2            Home Medications    Prior to Admission medications   Medication Sig Start Date End Date Taking? Authorizing Provider  busPIRone  (BUSPAR ) 5 MG tablet TAKE 1 TABLET BY MOUTH THREE TIMES DAILY 09/24/22   Lendia Quay A, NP  ibuprofen  (ADVIL ) 600 MG tablet Take 1 tablet (600 mg total) by mouth every 6 (six) hours. 03/18/22   Ervin, Michael L, MD  Iron , Ferrous Sulfate , 325 (65 Fe) MG TABS Take 1 tablet by mouth every other day. 03/18/22   Ervin, Michael L, MD  levocetirizine (XYZAL ) 5 MG tablet TAKE 1 TABLET BY MOUTH ONCE DAILY IN THE EVENING 09/24/22   Javan Messing, NP    Family History Family History  Problem Relation Age of Onset   Thyroid  disease Maternal Aunt    Thyroid  disease Maternal Grandmother    Stroke Maternal Grandmother    Heart disease Maternal Grandmother    Hyperlipidemia Maternal Grandmother    Hypertension Maternal Grandmother    Cancer Maternal Grandfather        skin   Anesthesia problems Neg Hx     Social History Social History   Tobacco Use  Smoking status: Never   Smokeless tobacco: Never  Vaping Use   Vaping status: Never Used  Substance Use Topics   Alcohol use: No   Drug use: No     Allergies   Patient has no known allergies.   Review of Systems Review of Systems Per HPI  Physical Exam Triage Vital Signs ED Triage Vitals  Encounter Vitals Group     BP 12/06/23 1828 (!) 146/87     Systolic BP Percentile --      Diastolic BP Percentile --      Pulse Rate 12/06/23 1828 63     Resp 12/06/23 1828 18     Temp 12/06/23 1828 (!) 97 F (36.1 C)     Temp Source 12/06/23 1828 Oral     SpO2 12/06/23 1828 98 %     Weight --      Height --      Head Circumference --      Peak Flow --      Pain Score 12/06/23 1830 0     Pain Loc --      Pain Education --      Exclude from Growth Chart --    No data found.  Updated Vital  Signs BP (!) 146/87 (BP Location: Right Arm)   Pulse 63   Temp (!) 97 F (36.1 C) (Oral)   Resp 18   LMP 11/29/2023 (Within Days)   SpO2 98%   Breastfeeding No   Visual Acuity Right Eye Distance:   Left Eye Distance:   Bilateral Distance:    Right Eye Near:   Left Eye Near:    Bilateral Near:     Physical Exam Vitals and nursing note reviewed.  Constitutional:      Appearance: Normal appearance. She is not ill-appearing.  HENT:     Head: Atraumatic.  Eyes:     Extraocular Movements: Extraocular movements intact.     Conjunctiva/sclera: Conjunctivae normal.  Cardiovascular:     Rate and Rhythm: Normal rate.  Pulmonary:     Effort: Pulmonary effort is normal.  Abdominal:     General: Bowel sounds are normal. There is no distension.     Palpations: Abdomen is soft.     Tenderness: There is no abdominal tenderness. There is no right CVA tenderness, left CVA tenderness or guarding.  Musculoskeletal:        General: Normal range of motion.     Cervical back: Normal range of motion and neck supple.  Skin:    General: Skin is warm and dry.  Neurological:     Mental Status: She is alert and oriented to person, place, and time.  Psychiatric:        Mood and Affect: Mood normal.        Thought Content: Thought content normal.        Judgment: Judgment normal.      UC Treatments / Results  Labs (all labs ordered are listed, but only abnormal results are displayed) Labs Reviewed  POCT URINALYSIS DIP (MANUAL ENTRY) - Abnormal; Notable for the following components:      Result Value   Clarity, UA cloudy (*)    Bilirubin, UA small (*)    Spec Grav, UA >=1.030 (*)    Protein Ur, POC =30 (*)    All other components within normal limits  CERVICOVAGINAL ANCILLARY ONLY    EKG   Radiology No results found.  Procedures Procedures (including critical care time)  Medications Ordered  in UC Medications - No data to display  Initial Impression / Assessment and Plan /  UC Course  I have reviewed the triage vital signs and the nursing notes.  Pertinent labs & imaging results that were available during my care of the patient were reviewed by me and considered in my medical decision making (see chart for details).     Urinalysis today without evidence of urinary tract infection, vaginal swab pending for further evaluation.  Discussed supportive over-the-counter medications, home care and return precautions while awaiting results.  Adjust if needed based on results.  Final Clinical Impressions(s) / UC Diagnoses   Final diagnoses:  Dysuria     Discharge Instructions      Your urine does not show a urinary tract infection today.  We have sent out a vaginal swab for further evaluation and we will let you know if anything comes back positive on this.  In the meantime, you may try female health probiotics, boric acid vaginal suppositories daily as needed and avoid any scented soaps, feminine wipes or feminine washes as these can cause a lot of irritation.  Follow-up for significantly worsening symptoms  ED Prescriptions   None    PDMP not reviewed this encounter.   Corbin Dess, [place] 12/06/23 [phone]

## 2023-12-06 NOTE — Discharge Instructions (Signed)
 Your urine does not show a urinary tract infection today.  We have sent out a vaginal swab for further evaluation and we will let you know if anything comes back positive on this.  In the meantime, you may try female health probiotics, boric acid vaginal suppositories daily as needed and avoid any scented soaps, feminine wipes or feminine washes as these can cause a lot of irritation.  Follow-up for significantly worsening symptoms

## 2023-12-06 NOTE — ED Triage Notes (Signed)
 Pt reports UTI sx's lower back pain, burning with urination.

## 2023-12-07 LAB — CERVICOVAGINAL ANCILLARY ONLY
Bacterial Vaginitis (gardnerella): POSITIVE — AB
Candida Glabrata: NEGATIVE
Candida Vaginitis: NEGATIVE
Chlamydia: NEGATIVE
Comment: NEGATIVE
Comment: NEGATIVE
Comment: NEGATIVE
Comment: NEGATIVE
Comment: NEGATIVE
Comment: NORMAL
Neisseria Gonorrhea: NEGATIVE
Trichomonas: NEGATIVE

## 2023-12-08 ENCOUNTER — Telehealth: Payer: Self-pay | Admitting: Emergency Medicine

## 2023-12-08 ENCOUNTER — Ambulatory Visit (HOSPITAL_COMMUNITY): Payer: Self-pay

## 2023-12-08 MED ORDER — METRONIDAZOLE 500 MG PO TABS: 500.0000 mg | ORAL_TABLET | Freq: Two times a day (BID) | ORAL | 0 refills | Status: AC

## 2023-12-08 NOTE — Telephone Encounter (Signed)
 Pt called and inquired about cervicovaginal swab results. Pt tested positive for BV and was wondering if anything needed to be px. Consulted provider, confirmed [facility] [place], Metronidazole  500 MG PO BID x7 days electronically sent in.

## 2024-07-06 ENCOUNTER — Telehealth: Admitting: Family Medicine

## 2024-07-06 NOTE — Progress Notes (Signed)
 Pt did not show up for visit-DWB

## 2024-07-07 ENCOUNTER — Telehealth: Admitting: Physician Assistant

## 2024-07-07 DIAGNOSIS — K047 Periapical abscess without sinus: Secondary | ICD-10-CM | POA: Diagnosis not present

## 2024-07-07 MED ORDER — AMOXICILLIN-POT CLAVULANATE 875-125 MG PO TABS: 1.0000 | ORAL_TABLET | Freq: Two times a day (BID) | ORAL | 0 refills | Status: AC

## 2024-07-07 MED ORDER — CHLORHEXIDINE GLUCONATE 0.12 % MT SOLN: 15.0000 mL | Freq: Two times a day (BID) | OROMUCOSAL | 0 refills | Status: AC

## 2024-07-07 NOTE — Patient Instructions (Signed)
 " Julia Padilla, thank you for joining Delon CHRISTELLA Dickinson, PA-C for today's virtual visit.  While this provider is not your primary care provider (PCP), if your PCP is located in our provider database this encounter information will be shared with them immediately following your visit.   A [facility] MyChart account gives you access to today's visit and all your visits, tests, and labs performed at [facility]  click here if you don't have a [facility] MyChart account or go to [email or web address]  Consent: (Patient) Julia Padilla provided verbal consent for this virtual visit at the beginning of the encounter.  Current Medications:  Current Outpatient Medications:    amoxicillin -clavulanate (AUGMENTIN ) 875-125 MG tablet, Take 1 tablet by mouth 2 (two) times daily., Disp: 14 tablet, Rfl: 0   chlorhexidine  (PERIDEX ) 0.12 % solution, Use as directed 15 mLs in the mouth or throat 2 (two) times daily., Disp: 120 mL, Rfl: 0   busPIRone  (BUSPAR ) 5 MG tablet, TAKE 1 TABLET BY MOUTH THREE TIMES DAILY, Disp: 90 tablet, Rfl: 2   ibuprofen  (ADVIL ) 600 MG tablet, Take 1 tablet (600 mg total) by mouth every 6 (six) hours., Disp: 30 tablet, Rfl: 0   Iron , Ferrous Sulfate , 325 (65 Fe) MG TABS, Take 1 tablet by mouth every other day., Disp: 30 tablet, Rfl: 1   levocetirizine (XYZAL ) 5 MG tablet, TAKE 1 TABLET BY MOUTH ONCE DAILY IN THE EVENING, Disp: 30 tablet, Rfl: 6   Medications ordered in this encounter:  Meds ordered this encounter  Medications   amoxicillin -clavulanate (AUGMENTIN ) 875-125 MG tablet    Sig: Take 1 tablet by mouth 2 (two) times daily.    Dispense:  14 tablet    Refill:  0    Supervising Provider:   BLAISE ALEENE KIDD [[id number]]   chlorhexidine  (PERIDEX ) 0.12 % solution    Sig: Use as directed 15 mLs in the mouth or throat 2 (two) times daily.    Dispense:  120 mL    Refill:  0    Supervising Provider:   BLAISE ALEENE KIDD [[id number]]     *If you need  refills on other medications prior to your next appointment, please contact your pharmacy*  Follow-Up: Call back or seek an in-person evaluation if the symptoms worsen or if the condition fails to improve as anticipated.   Virtual Care [phone]  Other Instructions Dental Abscess  A dental abscess is an infection around a tooth that may involve pain, swelling, and a collection of pus, as well as other symptoms. Treatment is important to help with symptoms and to prevent the infection from spreading. The general types of dental abscesses are: Pulpal abscess. This abscess may form from the inner part of the tooth (pulp). Periodontal abscess. This abscess may form from the gum. What are the causes? This condition is caused by a bacterial infection in or around the tooth. It may result from: Severe tooth decay (cavities). Trauma to the tooth, such as a broken or chipped tooth. What increases the risk? This condition is more likely to develop in males. It is also more likely to develop in people who: Have cavities. Have severe gum disease. Eat sugary snacks between meals. Use tobacco products. Have diabetes. Have a weakened disease-fighting system (immune system). Do not brush and care for their teeth regularly. What are the signs or symptoms? Mild symptoms of this condition include: Tenderness. Bad breath. Fever. A bitter taste in the mouth. Pain in and around the  infected tooth. Moderate symptoms of this condition include: Swollen neck glands. Chills. Pus drainage. Swelling and redness around the infected tooth, in the mouth, or in the face. Severe pain in and around the infected tooth. Severe symptoms of this condition include: Difficulty swallowing. Difficulty opening the mouth. Nausea. Vomiting. How is this diagnosed? This condition is diagnosed based on: Your symptoms and your medical and dental history. An examination of the infected tooth. During the  exam, your dental care provider may tap on the infected tooth. You may also need to have X-rays taken of the affected area. How is this treated? This condition is treated by getting rid of the infection. This may be done with: Antibiotic medicines. These may be used in certain situations. Antibacterial mouth rinse. Incision and drainage. This procedure is done by making an incision in the abscess to drain out the pus. Removing pus is the first priority in treating an abscess. A root canal. This may be performed to save the tooth. Your dental care provider accesses the visible part of your tooth (crown) with a drill and removes any infected pulp. Then the space is filled and sealed off. Tooth extraction. The tooth is pulled out if it cannot be saved by other treatment. You may also receive treatment for pain, such as: Acetaminophen  or NSAIDs. Gels that contain a numbing medicine. An injection to block the pain near your nerve. Follow these instructions at home: Medicines Take over-the-counter and prescription medicines only as told by your dental care provider. If you were prescribed an antibiotic, take it as told by your dental care provider. Do not stop taking the antibiotic even if you start to feel better. If you were prescribed a gel that contains a numbing medicine, use it exactly as told in the directions. Do not use these gels for children who are younger than 43 years of age. Use an antibacterial mouth rinse as told by your dental care provider. General instructions  Gargle with a mixture of salt and water 3-4 times a day or as needed. To make salt water, completely dissolve -1 tsp (3-6 g) of salt in 1 cup (237 mL) of warm water. Eat a soft diet while your abscess is healing. Drink enough fluid to keep your urine pale yellow. Do not apply heat to the outside of your mouth. Do not use any products that contain nicotine or tobacco. These products include cigarettes, chewing tobacco,  and vaping devices, such as e-cigarettes. If you need help quitting, ask your dental care provider. Keep all follow-up visits. This is important. How is this prevented?  Excellent dental home care, which includes brushing your teeth every morning and night with fluoride toothpaste. Floss one time each day. Get regularly scheduled dental cleanings. Consider having a dental sealant applied on teeth that have deep grooves to prevent cavities. Drink fluoridated water regularly. This includes most tap water. Check the label on bottled water to see if it contains fluoride. Reduce or eliminate sugary drinks. Eat healthy meals and snacks. Wear a mouth guard or face shield to protect your teeth while playing sports. Contact a health care provider if: Your pain is worse and is not helped by medicine. You have swelling. You see pus around the tooth. You have a fever or chills. Get help right away if: Your symptoms suddenly get worse. You have a very bad headache. You have problems breathing or swallowing. You have trouble opening your mouth. You have swelling in your neck or around your eye.  These symptoms may represent a serious problem that is an emergency. Do not wait to see if the symptoms will go away. Get medical help right away. Call your local emergency services (911 in the [place] Do not drive yourself to the hospital. Summary A dental abscess is a collection of pus in or around a tooth that results from an infection. A dental abscess may result from severe tooth decay, trauma to the tooth, or severe gum disease around a tooth. Symptoms include severe pain, swelling, redness, and drainage of pus in and around the infected tooth. The first priority in treating a dental abscess is to drain out the pus. Treatment may also involve removing damage inside the tooth (root canal) or extracting the tooth. This information is not intended to replace advice given to you by your health care provider.  Make sure you discuss any questions you have with your health care provider. Document Revised: 08/22/2020 Document Reviewed: 08/22/2020 Elsevier Patient Education  2024 Elsevier Inc.   If you have been instructed to have an in-person evaluation today at a local Urgent Care facility, please use the link below. It will take you to a list of all of our available [facility], including address, phone number and hours of operation. Please do not delay care.  [facility]  If you or a family member do not have a primary care provider, use the link below to schedule a visit and establish care. When you choose a [facility] primary care physician or advanced practice provider, you gain a long-term partner in health. Find a Primary Care Provider  Learn more about [facility]'s in-office and virtual care options: [facility] - Get Care Now "

## 2024-07-07 NOTE — Progress Notes (Signed)
 " Virtual Visit Consent   Julia Padilla, you are scheduled for a virtual visit with a [facility] provider today. Just as with appointments in the office, your consent must be obtained to participate. Your consent will be active for this visit and any virtual visit you may have with one of our providers in the next 365 days. If you have a MyChart account, a copy of this consent can be sent to you electronically.  As this is a virtual visit, video technology does not allow for your provider to perform a traditional examination. This may limit your provider's ability to fully assess your condition. If your provider identifies any concerns that need to be evaluated in person or the need to arrange testing (such as labs, EKG, etc.), we will make arrangements to do so. Although advances in technology are sophisticated, we cannot ensure that it will always work on either your end or our end. If the connection with a video visit is poor, the visit may have to be switched to a telephone visit. With either a video or telephone visit, we are not always able to ensure that we have a secure connection.  By engaging in this virtual visit, you consent to the provision of healthcare and authorize for your insurance to be billed (if applicable) for the services provided during this visit. Depending on your insurance coverage, you may receive a charge related to this service.  I need to obtain your verbal consent now. Are you willing to proceed with your visit today? Julia Padilla has provided verbal consent on 07/07/2024 for a virtual visit (video or telephone). Delon CHRISTELLA Dickinson, PA-C  Date: 07/07/2024 9:23 AM   Virtual Visit via Video Note   I, Delon CHRISTELLA Dickinson, connected with  Julia Padilla  ([id number], [birth year]) on 07/07/2024 at  9:15 AM EST by a video-enabled telemedicine application and verified that I am speaking with the correct person using two identifiers.  Location: Patient: Virtual Visit  Location Patient: Home Provider: Virtual Visit Location Provider: Home Office   I discussed the limitations of evaluation and management by telemedicine and the availability of in person appointments. The patient expressed understanding and agreed to proceed.    History of Present Illness: Julia Padilla is a 49 y.o. who identifies as a female who was assigned female at birth, and is being seen today for dental abscess.  HPI: Dental Pain  This is a new problem. The current episode started in the past 7 days (3 days). The problem occurs constantly. The problem has been gradually worsening. The pain is moderate. Pertinent negatives include no difficulty swallowing, facial pain, fever, oral bleeding, sinus pressure or thermal sensitivity. Associated symptoms comments: Headache. She has tried acetaminophen  and NSAIDs for the symptoms. The treatment provided no relief.   Has dental appt Jan 21st.  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  Resting comfortably at home.  Head is normocephalic, atraumatic.  No labored breathing.  Speech is clear and coherent with logical content.  Patient is alert and oriented at baseline.    Assessment and Plan: 1. Dental abscess (Primary) - amoxicillin -clavulanate (AUGMENTIN ) 875-125 MG tablet; Take 1 tablet by mouth 2 (two) times daily.  Dispense: 14 tablet; Refill: 0 - chlorhexidine  (PERIDEX ) 0.12 % solution; Use as directed 15 mLs in the mouth or throat 2 (two) times daily.  Dispense: 120 mL; Refill: 0  - Suspected infection with broken tooth - Augmentin  and Peridex  prescribed - Can use ice on outside jaw/cheek for swelling - Can also take tylenol  for pain with other medications - Discussed DenTemp putty that can be used to cover a broken tooth - Schedule a follow with a dentist as soon as possible (Can contact [facility] or [facility] [[phone]] associated with [place] [facility] if underinsured or uninsured) - Seek in person evaluation if symptoms fail to improve or if they worsen   Follow Up Instructions: I discussed the assessment and treatment plan with the patient. The patient was provided an opportunity to ask questions and all were answered. The patient agreed with the plan and demonstrated an understanding of the instructions.  A copy of instructions were sent to the patient via MyChart unless otherwise noted below.    The patient was advised to call back or seek an in-person evaluation if the symptoms worsen or if the condition fails to improve as anticipated.    Delon HERO Elvi Leventhal, PA-C     [1] No Known Allergies [2]  Current Outpatient Medications:    amoxicillin -clavulanate (AUGMENTIN ) 875-125 MG tablet, Take 1 tablet by mouth 2 (two) times daily., Disp: 14 tablet, Rfl: 0   chlorhexidine  (PERIDEX ) 0.12 % solution,  Use as directed 15 mLs in the mouth or throat 2 (two) times daily., Disp: 120 mL, Rfl: 0   busPIRone  (BUSPAR ) 5 MG tablet, TAKE 1 TABLET BY MOUTH THREE TIMES DAILY, Disp: 90 tablet, Rfl: 2   ibuprofen  (ADVIL ) 600 MG tablet, Take 1 tablet (600 mg total) by mouth every 6 (six) hours., Disp: 30 tablet, Rfl: 0   Iron , Ferrous Sulfate , 325 (65 Fe) MG TABS, Take 1 tablet by mouth every other day., Disp: 30 tablet, Rfl: 1   levocetirizine (XYZAL ) 5 MG tablet, TAKE 1 TABLET BY MOUTH ONCE DAILY IN THE EVENING, Disp: 30 tablet, Rfl: 6  "

## 2024-07-08 ENCOUNTER — Other Ambulatory Visit: Payer: Self-pay

## 2024-07-08 ENCOUNTER — Emergency Department (HOSPITAL_COMMUNITY)
Admission: EM | Admit: 2024-07-08 | Discharge: 2024-07-08 | Disposition: A | Discharge status: Home / Self Care | Attending: Emergency Medicine | Admitting: Emergency Medicine

## 2024-07-08 DIAGNOSIS — K0889 Other specified disorders of teeth and supporting structures: Secondary | ICD-10-CM | POA: Diagnosis present

## 2024-07-08 DIAGNOSIS — K029 Dental caries, unspecified: Secondary | ICD-10-CM | POA: Insufficient documentation

## 2024-07-08 MED ORDER — HYDROCODONE-ACETAMINOPHEN 5-325 MG PO TABS: 1.0000 | ORAL_TABLET | ORAL | 0 refills | Status: AC | PRN

## 2024-07-08 MED ORDER — KETOROLAC TROMETHAMINE 30 MG/ML IJ SOLN
30.0000 mg | Freq: Once | INTRAMUSCULAR | Status: AC
  Administered 2024-07-08: 30 mg via INTRAMUSCULAR
  Filled 2024-07-08: qty 1

## 2024-07-08 NOTE — ED Triage Notes (Signed)
 Pt c/o left lower jaw pain that radiates to her neck and head. Pt started ABT yesterday but the pain is unbearable.

## 2024-07-08 NOTE — ED Provider Notes (Signed)
 " [facility] AT [facility] Provider Note   CSN: [id number] Arrival date & time: 07/08/24  9381     Patient presents with: Dental Pain   Julia Padilla is a 49 y.o. female.   Pt is a 49 yo female with pmhx significant for anemia.  She has had dental pain for the past few days.  She did a virtual appointment with PCP yesterday and was given a rx for augmentin  and peridex .  She said the pain is intolerable.  She's tried tylenol  and ibuprofen  without improvement in pain.  She has a dentist appt on 1/21.       Prior to Admission medications  Medication Sig Start Date End Date Taking? Authorizing Provider  HYDROcodone -acetaminophen  (NORCO/VICODIN) 5-325 MG tablet Take 1 tablet by mouth every 4 (four) hours as needed. 07/08/24  Yes Dean Clarity, MD  amoxicillin -clavulanate (AUGMENTIN ) 875-125 MG tablet Take 1 tablet by mouth 2 (two) times daily. 07/07/24   Vivienne Delon HERO, PA-C  busPIRone  (BUSPAR ) 5 MG tablet TAKE 1 TABLET BY MOUTH THREE TIMES DAILY 09/24/22   Signa Delon A, NP  chlorhexidine  (PERIDEX ) 0.12 % solution Use as directed 15 mLs in the mouth or throat 2 (two) times daily. 07/07/24   Vivienne Delon HERO, PA-C  ibuprofen  (ADVIL ) 600 MG tablet Take 1 tablet (600 mg total) by mouth every 6 (six) hours. 03/18/22   Ervin, Michael L, MD  Iron , Ferrous Sulfate , 325 (65 Fe) MG TABS Take 1 tablet by mouth every other day. 03/18/22   Ervin, Michael L, MD  levocetirizine (XYZAL ) 5 MG tablet TAKE 1 TABLET BY MOUTH ONCE DAILY IN THE EVENING 09/24/22   Signa Delon LABOR, NP    Allergies: Patient has no known allergies.    Review of Systems  HENT:  Positive for dental problem.   All other systems reviewed and are negative.   Updated Vital Signs BP (!) 151/79   Pulse 80   Temp 97.9 F (36.6 C) (Oral)   Resp 17   Ht 5' 6 (1.676 m)   Wt 102.1 kg   LMP 07/08/2024 (Exact Date)   SpO2 98%   BMI 36.32 kg/m   Physical Exam Vitals and nursing  note reviewed.  Constitutional:      Appearance: Normal appearance.  HENT:     Head: Normocephalic and atraumatic.     Right Ear: Tympanic membrane, ear canal and external ear normal.     Left Ear: Tympanic membrane, ear canal and external ear normal.     Nose: Nose normal.     Mouth/Throat:     Comments: Pain to left lower jaw.  No significant swelling.  No abscess to drain.  No ludwig's. Cardiovascular:     Rate and Rhythm: Normal rate and regular rhythm.     Pulses: Normal pulses.     Heart sounds: Normal heart sounds.  Pulmonary:     Effort: Pulmonary effort is normal.     Breath sounds: Normal breath sounds.  Musculoskeletal:        General: Normal range of motion.     Cervical back: Normal range of motion and neck supple.  Skin:    General: Skin is warm.     Capillary Refill: Capillary refill takes less than 2 seconds.  Neurological:     General: No focal deficit present.     Mental Status: She is alert and oriented to person, place, and time.  Psychiatric:  Mood and Affect: Mood normal.        Behavior: Behavior normal.     (all labs ordered are listed, but only abnormal results are displayed) Labs Reviewed - No data to display  EKG: None  Radiology: No results found.   Procedures   Medications Ordered in the ED  ketorolac  (TORADOL ) 30 MG/ML injection 30 mg (has no administration in time range)                                    Medical Decision Making Risk Prescription drug management.   This patient presents to the ED for concern of dental pain, this involves an extensive number of treatment options, and is a complaint that carries with it a high risk of complications and morbidity.  The differential diagnosis includes dental caries, dental abscess   Co morbidities that complicate the patient evaluation  anemia   Additional history obtained:  Additional history obtained from epic chart review   Medicines ordered and prescription drug  management:  I ordered medication including toradol   for sx  Reevaluation of the patient after these medicines showed that the patient improved I have reviewed the patients home medicines and have made adjustments as needed  Problem List / ED Course:  Dental caries:  pt started abx yesterday.  She is d/c with a rx for lortab.  She is to continue abx.  Return if worse. F/u with dentist.   Reevaluation:  After the interventions noted above, I reevaluated the patient and found that they have :improved   Social Determinants of Health:  Lives at home   Dispostion:  After consideration of the diagnostic results and the patients response to treatment, I feel that the patent would benefit from discharge with outpatient f/u.       Final diagnoses:  Dental caries    ED Discharge Orders          Ordered    HYDROcodone -acetaminophen  (NORCO/VICODIN) 5-325 MG tablet  Every 4 hours PRN        07/08/24 0748               Corda Shutt, MD 07/08/24 [phone]  "

## 2024-07-08 NOTE — Discharge Instructions (Signed)
 Continue Augmentin  and Peridex .

## 2024-08-05 ENCOUNTER — Ambulatory Visit (HOSPITAL_COMMUNITY): Payer: Self-pay

## 2024-08-29 ENCOUNTER — Ambulatory Visit: Admitting: Family Medicine
